# Patient Record
Sex: Male | Born: 1950 | Race: Black or African American | Hispanic: No | Marital: Married | Smoking: Former smoker
Health system: Southern US, Community
[De-identification: ages and names within clinical notes are randomized; demographics above are authoritative.]

## PROBLEM LIST (undated history)

## (undated) DIAGNOSIS — Z531 Procedure and treatment not carried out because of patient's decision for reasons of belief and group pressure: Secondary | ICD-10-CM

## (undated) DIAGNOSIS — R011 Cardiac murmur, unspecified: Secondary | ICD-10-CM

## (undated) DIAGNOSIS — E119 Type 2 diabetes mellitus without complications: Secondary | ICD-10-CM

## (undated) DIAGNOSIS — IMO0001 Reserved for inherently not codable concepts without codable children: Secondary | ICD-10-CM

## (undated) DIAGNOSIS — F419 Anxiety disorder, unspecified: Secondary | ICD-10-CM

## (undated) DIAGNOSIS — C9110 Chronic lymphocytic leukemia of B-cell type not having achieved remission: Secondary | ICD-10-CM

## (undated) DIAGNOSIS — M545 Low back pain, unspecified: Secondary | ICD-10-CM

## (undated) DIAGNOSIS — G4733 Obstructive sleep apnea (adult) (pediatric): Secondary | ICD-10-CM

## (undated) DIAGNOSIS — J189 Pneumonia, unspecified organism: Secondary | ICD-10-CM

## (undated) DIAGNOSIS — D573 Sickle-cell trait: Secondary | ICD-10-CM

## (undated) DIAGNOSIS — Z9989 Dependence on other enabling machines and devices: Secondary | ICD-10-CM

## (undated) DIAGNOSIS — K219 Gastro-esophageal reflux disease without esophagitis: Secondary | ICD-10-CM

## (undated) DIAGNOSIS — I509 Heart failure, unspecified: Secondary | ICD-10-CM

## (undated) DIAGNOSIS — M1990 Unspecified osteoarthritis, unspecified site: Secondary | ICD-10-CM

## (undated) DIAGNOSIS — G8929 Other chronic pain: Secondary | ICD-10-CM

## (undated) DIAGNOSIS — IMO0002 Reserved for concepts with insufficient information to code with codable children: Secondary | ICD-10-CM

## (undated) HISTORY — PX: SHOULDER ARTHROSCOPY W/ ROTATOR CUFF REPAIR: SHX2400

## (undated) HISTORY — PX: CATARACT EXTRACTION W/ INTRAOCULAR LENS  IMPLANT, BILATERAL: SHX1307

## (undated) HISTORY — PX: COLONOSCOPY W/ POLYPECTOMY: SHX1380

---

## 2009-08-30 HISTORY — PX: CARDIAC CATHETERIZATION: SHX172

## 2010-03-27 ENCOUNTER — Inpatient Hospital Stay (HOSPITAL_COMMUNITY): Admission: EM | Admit: 2010-03-27 | Discharge: 2010-03-29 | Payer: Self-pay | Admitting: Emergency Medicine

## 2010-11-14 LAB — LIPID PANEL
Cholesterol: 101 mg/dL (ref 0–200)
HDL: 30 mg/dL — ABNORMAL LOW (ref >39)
LDL Cholesterol: 57 mg/dL (ref 0–99)
Total CHOL/HDL Ratio: 3.4 RATIO
VLDL: 14 mg/dL (ref 0–40)

## 2010-11-14 LAB — GLUCOSE, CAPILLARY
Glucose-Capillary: 101 mg/dL — ABNORMAL HIGH (ref 70–99)
Glucose-Capillary: 128 mg/dL — ABNORMAL HIGH (ref 70–99)
Glucose-Capillary: 207 mg/dL — ABNORMAL HIGH (ref 70–99)
Glucose-Capillary: 208 mg/dL — ABNORMAL HIGH (ref 70–99)
Glucose-Capillary: 289 mg/dL — ABNORMAL HIGH (ref 70–99)

## 2010-11-14 LAB — CBC
HCT: 45.4 % (ref 39.0–52.0)
Hemoglobin: 15.1 g/dL (ref 13.0–17.0)
Hemoglobin: 16.2 g/dL (ref 13.0–17.0)
MCH: 28.1 pg (ref 26.0–34.0)
MCV: 84.1 fL (ref 78.0–100.0)
Platelets: 217 10*3/uL (ref 150–400)
RBC: 5.37 MIL/uL (ref 4.22–5.81)
RBC: 5.78 MIL/uL (ref 4.22–5.81)
RDW: 16.7 % — ABNORMAL HIGH (ref 11.5–15.5)
RDW: 16.7 % — ABNORMAL HIGH (ref 11.5–15.5)
WBC: 6 10*3/uL (ref 4.0–10.5)

## 2010-11-14 LAB — POCT CARDIAC MARKERS
CKMB, poc: 16.2 ng/mL (ref 1.0–8.0)
Myoglobin, poc: 411 ng/mL (ref 12–200)
Troponin i, poc: <0.05 ng/mL (ref 0.00–0.09)

## 2010-11-14 LAB — COMPREHENSIVE METABOLIC PANEL
ALT: 37 U/L (ref 0–53)
AST: 41 U/L — ABNORMAL HIGH (ref 0–37)
Alkaline Phosphatase: 76 U/L (ref 39–117)
BUN: 11 mg/dL (ref 6–23)
CO2: 26 mEq/L (ref 19–32)
Calcium: 8.5 mg/dL (ref 8.4–10.5)
Chloride: 102 mEq/L (ref 96–112)
GFR calc Af Amer: >60 mL/min (ref >60)
Total Bilirubin: 0.9 mg/dL (ref 0.3–1.2)
Total Protein: 6.1 g/dL (ref 6.0–8.3)

## 2010-11-14 LAB — HEPARIN LEVEL (UNFRACTIONATED): Heparin Unfractionated: <0.1 IU/mL — ABNORMAL LOW (ref 0.30–0.70)

## 2010-11-14 LAB — BRAIN NATRIURETIC PEPTIDE
Pro B Natriuretic peptide (BNP): 147 pg/mL — ABNORMAL HIGH (ref 0.0–100.0)
Pro B Natriuretic peptide (BNP): 247 pg/mL — ABNORMAL HIGH (ref 0.0–100.0)

## 2010-11-14 LAB — BASIC METABOLIC PANEL
BUN: 11 mg/dL (ref 6–23)
Calcium: 9 mg/dL (ref 8.4–10.5)
Chloride: 105 mEq/L (ref 96–112)
Potassium: 3.7 mEq/L (ref 3.5–5.1)

## 2010-11-14 LAB — PROTIME-INR
INR: 1.07 (ref 0.00–1.49)
Prothrombin Time: 13.8 seconds (ref 11.6–15.2)

## 2010-11-14 LAB — DIFFERENTIAL
Basophils Absolute: 0 10*3/uL (ref 0.0–0.1)
Lymphocytes Relative: 26 % (ref 12–46)
Lymphs Abs: 1.6 10*3/uL (ref 0.7–4.0)
Monocytes Relative: 9 % (ref 3–12)

## 2010-11-14 LAB — HEMOGLOBIN A1C
Hgb A1c MFr Bld: 11.7 % — ABNORMAL HIGH (ref <5.7)
Mean Plasma Glucose: 289 mg/dL — ABNORMAL HIGH (ref <117)

## 2010-11-14 LAB — POCT I-STAT, CHEM 8
BUN: 12 mg/dL (ref 6–23)
Calcium, Ion: 1.13 mmol/L (ref 1.12–1.32)
Glucose, Bld: 149 mg/dL — ABNORMAL HIGH (ref 70–99)
TCO2: 24 mmol/L (ref 0–100)

## 2010-11-14 LAB — TROPONIN I: Troponin I: 0.09 ng/mL — ABNORMAL HIGH (ref 0.00–0.06)

## 2010-11-14 LAB — CARDIAC PANEL(CRET KIN+CKTOT+MB+TROPI)
CK, MB: 12.1 ng/mL (ref 0.3–4.0)
Relative Index: 3.3 — ABNORMAL HIGH (ref 0.0–2.5)

## 2010-11-14 LAB — CK TOTAL AND CKMB (NOT AT ARMC): CK, MB: 16.6 ng/mL (ref 0.3–4.0)

## 2012-08-30 DIAGNOSIS — J189 Pneumonia, unspecified organism: Secondary | ICD-10-CM

## 2012-08-30 HISTORY — DX: Pneumonia, unspecified organism: J18.9

## 2013-01-12 ENCOUNTER — Emergency Department (HOSPITAL_COMMUNITY): Payer: Worker's Compensation

## 2013-01-12 ENCOUNTER — Emergency Department (HOSPITAL_COMMUNITY)
Admission: EM | Admit: 2013-01-12 | Discharge: 2013-01-12 | Disposition: A | Discharge status: Home / Self Care | Payer: Worker's Compensation | Attending: Emergency Medicine | Admitting: Emergency Medicine

## 2013-01-12 ENCOUNTER — Encounter (HOSPITAL_COMMUNITY): Payer: Self-pay | Admitting: Family Medicine

## 2013-01-12 DIAGNOSIS — I252 Old myocardial infarction: Secondary | ICD-10-CM | POA: Insufficient documentation

## 2013-01-12 DIAGNOSIS — M545 Low back pain, unspecified: Secondary | ICD-10-CM | POA: Insufficient documentation

## 2013-01-12 DIAGNOSIS — S52609A Unspecified fracture of lower end of unspecified ulna, initial encounter for closed fracture: Secondary | ICD-10-CM | POA: Insufficient documentation

## 2013-01-12 DIAGNOSIS — W108XXA Fall (on) (from) other stairs and steps, initial encounter: Secondary | ICD-10-CM | POA: Insufficient documentation

## 2013-01-12 DIAGNOSIS — Z79899 Other long term (current) drug therapy: Secondary | ICD-10-CM | POA: Insufficient documentation

## 2013-01-12 DIAGNOSIS — M25529 Pain in unspecified elbow: Secondary | ICD-10-CM | POA: Insufficient documentation

## 2013-01-12 DIAGNOSIS — Z7982 Long term (current) use of aspirin: Secondary | ICD-10-CM | POA: Insufficient documentation

## 2013-01-12 DIAGNOSIS — Z87891 Personal history of nicotine dependence: Secondary | ICD-10-CM | POA: Insufficient documentation

## 2013-01-12 DIAGNOSIS — M25569 Pain in unspecified knee: Secondary | ICD-10-CM | POA: Insufficient documentation

## 2013-01-12 DIAGNOSIS — R51 Headache: Secondary | ICD-10-CM | POA: Insufficient documentation

## 2013-01-12 DIAGNOSIS — M25519 Pain in unspecified shoulder: Secondary | ICD-10-CM | POA: Insufficient documentation

## 2013-01-12 DIAGNOSIS — T148XXA Other injury of unspecified body region, initial encounter: Secondary | ICD-10-CM

## 2013-01-12 DIAGNOSIS — W19XXXA Unspecified fall, initial encounter: Secondary | ICD-10-CM

## 2013-01-12 DIAGNOSIS — E119 Type 2 diabetes mellitus without complications: Secondary | ICD-10-CM | POA: Insufficient documentation

## 2013-01-12 DIAGNOSIS — Z794 Long term (current) use of insulin: Secondary | ICD-10-CM | POA: Insufficient documentation

## 2013-01-12 DIAGNOSIS — Y990 Civilian activity done for income or pay: Secondary | ICD-10-CM | POA: Insufficient documentation

## 2013-01-12 DIAGNOSIS — Y929 Unspecified place or not applicable: Secondary | ICD-10-CM | POA: Insufficient documentation

## 2013-01-12 LAB — POCT I-STAT, CHEM 8
BUN: 16 mg/dL (ref 6–23)
Calcium, Ion: 1.22 mmol/L (ref 1.13–1.30)
Chloride: 104 mEq/L (ref 96–112)
Creatinine, Ser: 1.2 mg/dL (ref 0.50–1.35)
Glucose, Bld: 121 mg/dL — ABNORMAL HIGH (ref 70–99)
Potassium: 3.6 mEq/L (ref 3.5–5.1)

## 2013-01-12 MED ORDER — LORAZEPAM 2 MG/ML IJ SOLN
1.0000 mg | Freq: Once | INTRAMUSCULAR | Status: AC
  Administered 2013-01-12: 1 mg via INTRAMUSCULAR
  Filled 2013-01-12: qty 1

## 2013-01-12 MED ORDER — MORPHINE SULFATE 10 MG/ML IJ SOLN
10.0000 mg | Freq: Once | INTRAMUSCULAR | Status: AC
  Administered 2013-01-12: 10 mg via INTRAMUSCULAR
  Filled 2013-01-12: qty 1

## 2013-01-12 NOTE — ED Notes (Signed)
Pt lost footing on last step of flight of stairs. Pt fell into mud, hitting right knee and hyperextending left wrist. Pt denies pain to right knee, c/o left wrist pain. Per PTAR, pt also stated feeling a "pop" in lower back. Pt c/o "pinch in neck to left shoulder and mid and lower back pain. Pt in LSB, head blocks, and C-collar. Alert and oriented x4. Pt in NAD at this time, interactive

## 2013-01-12 NOTE — ED Notes (Signed)
Wife stepping out. States they took him over to radiology

## 2013-01-12 NOTE — ED Notes (Signed)
CBG of 158 completed.

## 2013-01-12 NOTE — ED Provider Notes (Signed)
History     CSN: [id number]  Arrival date & time 01/12/13  4098   First MD Initiated Contact with Patient 01/12/13 [phone]      Chief Complaint  Patient presents with  . Fall    (Consider location/radiation/quality/duration/timing/severity/associated sxs/prior treatment) HPI 62 year old male with a past medical history of insulin-dependent diabetes mellitus, previous MI, status post cardiac CATHETERIZATION presents to the emergency department after FALL.Marland Kitchen Arrives via EMS with cervical collar, on long spine board in spinal precautions. Patient was at work when he lost his footing and fell off of a 1 Stair onto his right knee.patient fell onto his left side and heard something "pop in his back.  He is complaining of right knee pain, left knee pain, hip pain, pelvic pain, sacral pain, back pain, left shoulder pain, left elbow pain, left wrist pain, left fourth and fifth finger pain, and global headache.patient states that his blood sugars have been running normally he denies any high-power hyperglycemic events of weight, he denies having headache previous to the fall.  He denies hitting his head or loss of consciousness.  Patient denies any chest pressure, shortness of breath, he does have left shoulder pain that he feels is radiating up into his neck. Patient denies any chest pain, shortness of breath, nausea, vomiting, diaphoresis.  Denies fevers, chills, myalgias, arthralgias. Denies DOE, SOB, chest tightness or pressure, radiation to left arm, jaw or back, or diaphoresis. Denies dysuria, flank pain, suprapubic pain, frequency, urgency, or hematuria. Denies headaches, light headedness, weakness, visual disturbances. Denies abdominal pain, nausea, vomiting, diarrhea or constipation.    Past Medical History  Diagnosis Date  . Diabetes   . MI (myocardial infarction)     Past Surgical History  Procedure Laterality Date  . Cardiac catheterization  2011    No family history on  file.  History  Substance Use Topics  . Smoking status: Former Smoker    Types: Cigarettes  . Smokeless tobacco: Never Used  . Alcohol Use: No      Review of Systems Ten systems reviewed and are negative for acute change, except as noted in the HPI.   Allergies  Review of patient's allergies indicates no known allergies.  Home Medications   Current Outpatient Rx  Name  Route  Sig  Dispense  Refill  . aspirin 81 MG chewable tablet   Oral   Chew 81 mg by mouth daily.         . fish oil-omega-3 fatty acids 1000 MG capsule   Oral   Take 1 g by mouth daily.         Marland Kitchen FLUoxetine HCl (PROZAC PO)   Oral   Take 1 tablet by mouth daily.         Marland Kitchen GABAPENTIN, PHN, PO   Oral   Take by mouth.         . Insulin Human (INSULIN PUMP) 100 unit/ml SOLN   Subcutaneous   Inject into the skin continuous. novolog         . Multiple Vitamin (MULTIVITAMIN WITH MINERALS) TABS   Oral   Take 1 tablet by mouth daily.         Marland Kitchen OVER THE COUNTER MEDICATION   Oral   Take 1 tablet by mouth daily. Acid reducer         . PRESCRIPTION MEDICATION   Oral   Take 1 tablet by mouth. betablocker           BP 153/83  Pulse 96  Temp(Src) 98 F (36.7 C)  Resp 20  SpO2 94%  Physical Exam  Nursing note and vitals reviewed. Constitutional: He is oriented to person, place, and time. He appears well-developed and well-nourished. No distress.  Appears anxious  HENT:  Head: Normocephalic and atraumatic.  Eyes: Conjunctivae and EOM are normal. Pupils are equal, round, and reactive to light. No scleral icterus.  No dental trauma  Neck: Normal range of motion. Neck supple. No JVD present. No tracheal deviation present.  Cardiovascular: Normal rate, regular rhythm, normal heart sounds and intact distal pulses.   No murmur heard. Pulmonary/Chest: Effort normal and breath sounds normal. No respiratory distress. He has no wheezes. He exhibits no tenderness.  No crepitus  Abdominal:  Soft. He exhibits no distension. There is no tenderness. There is no guarding.  No bruising  Musculoskeletal: He exhibits no edema.  Abrasion over the left lateral knee. Full range of motion bilateral knees mild tenderness to palpation.  No pelvic instability.  Some tenderness over the left hip.  No bruising ecchymosis or deformities.  Full range of motion of bilateral shoulders tender to palpation at the a.c. Joint with positive crossarm test.  Tender to palpation of the left elbow, left wrist left fourth and fifth fingers.  Again no ecchymosis or deformity.  Lymphadenopathy:    He has no cervical adenopathy.  Neurological: He is alert and oriented to person, place, and time. No cranial nerve deficit. Coordination normal.  Skin: Skin is warm and dry. He is not diaphoretic.  Psychiatric: His behavior is normal.    ED Course  Procedures (including critical care time)  Labs Reviewed  GLUCOSE, CAPILLARY - Abnormal; Notable for the following:    Glucose-Capillary 158 (*)    All other components within normal limits   No results found.   1. Fall, initial encounter   2. Avulsion fracture       MDM  7:56 AM Filed Vitals:   01/12/13 0639  BP: 153/83  Pulse: 96  Temp: 98 F (36.7 C)  Resp: 20  Patient with pan-. Positive pain complaints after a fall with localized to the mechanism of injury.  Patient appears to be an otherwise healthy 62 year old male with no history of osteoporosis or osteopenia.  He has many pain complaints.  I do not suspect fracture.  However will obtain imaging of patient's areas of complaint.  Pain control initiated. Will assess for acs as patient fell, he is anxious appearing and poor historian. + left shoulder pain that I believe is from mechanical fall.  11:21 AM Negative troponin.  I do not suspect ACS. Small avulsion fracture of the left wrist. Again I do not suspect ACS and no dyspnea, SOB, CP.  Patient with known spondylolysis and herniated discs at [facility]  on MRI.  F/u handy for ortho.       Arthor Captain, PA-C 01/12/13 1125

## 2013-01-12 NOTE — ED Notes (Signed)
Abigail, PA back in to speak with patient and family.

## 2013-01-12 NOTE — ED Notes (Signed)
Abigail, PA at the bedside. Pt removed from the backboard.

## 2013-01-14 NOTE — ED Provider Notes (Signed)
Medical screening examination/treatment/procedure(s) were performed by non-physician practitioner and as supervising physician I was immediately available for consultation/collaboration.   Weber Monnier M Zyden Suman, DO 01/14/13 2331 

## 2013-12-18 ENCOUNTER — Telehealth: Payer: Self-pay | Admitting: Vascular Surgery

## 2013-12-18 ENCOUNTER — Encounter (HOSPITAL_COMMUNITY): Payer: Self-pay | Admitting: Pharmacy Technician

## 2013-12-18 ENCOUNTER — Other Ambulatory Visit: Payer: Self-pay

## 2013-12-18 NOTE — Telephone Encounter (Addendum)
Message copied by Gena Fray on Tue Dec 18, 2013  3:36 PM ------      Message from: Denman George      Created: Tue Dec 18, 2013  9:33 AM      Regarding: Consult appt. with [facility]SD       This pt. needs a new pt. Consult appt. with CSD prior to ALIF on 12/27/13.  Please remind him to bring copy of L-S spine film with him to the appt.  ------  12/18/13: spoke with pt, mailed letter, dpm

## 2013-12-21 ENCOUNTER — Ambulatory Visit (HOSPITAL_COMMUNITY)
Admission: RE | Admit: 2013-12-21 | Discharge: 2013-12-21 | Disposition: A | Discharge status: Home / Self Care | Payer: Self-pay | Source: Ambulatory Visit | Attending: Anesthesiology | Admitting: Anesthesiology

## 2013-12-21 ENCOUNTER — Encounter (HOSPITAL_COMMUNITY)
Admission: RE | Admit: 2013-12-21 | Discharge: 2013-12-21 | Disposition: A | Discharge status: Home / Self Care | Payer: Worker's Compensation | Source: Ambulatory Visit | Attending: Orthopedic Surgery | Admitting: Orthopedic Surgery

## 2013-12-21 ENCOUNTER — Encounter (HOSPITAL_COMMUNITY): Payer: Self-pay

## 2013-12-21 DIAGNOSIS — Z01818 Encounter for other preprocedural examination: Secondary | ICD-10-CM | POA: Insufficient documentation

## 2013-12-21 DIAGNOSIS — Z01812 Encounter for preprocedural laboratory examination: Secondary | ICD-10-CM | POA: Insufficient documentation

## 2013-12-21 HISTORY — DX: Pneumonia, unspecified organism: J18.9

## 2013-12-21 HISTORY — DX: Chronic lymphocytic leukemia of B-cell type not having achieved remission: C91.10

## 2013-12-21 HISTORY — DX: Cardiac murmur, unspecified: R01.1

## 2013-12-21 HISTORY — DX: Reserved for concepts with insufficient information to code with codable children: IMO0002

## 2013-12-21 HISTORY — DX: Gastro-esophageal reflux disease without esophagitis: K21.9

## 2013-12-21 HISTORY — DX: Sickle-cell trait: D57.3

## 2013-12-21 HISTORY — DX: Anxiety disorder, unspecified: F41.9

## 2013-12-21 LAB — SURGICAL PCR SCREEN
MRSA, PCR: NEGATIVE
Staphylococcus aureus: NEGATIVE

## 2013-12-21 LAB — CBC
HEMATOCRIT: 34.7 % — AB (ref 39.0–52.0)
HEMOGLOBIN: 11.8 g/dL — AB (ref 13.0–17.0)
MCH: 25.3 pg — ABNORMAL LOW (ref 26.0–34.0)
MCHC: 34 g/dL (ref 30.0–36.0)
MCV: 74.5 fL — ABNORMAL LOW (ref 78.0–100.0)
Platelets: 188 10*3/uL (ref 150–400)
RBC: 4.66 MIL/uL (ref 4.22–5.81)
RDW: 16.7 % — ABNORMAL HIGH (ref 11.5–15.5)
WBC: 11.1 10*3/uL — ABNORMAL HIGH (ref 4.0–10.5)

## 2013-12-21 LAB — BASIC METABOLIC PANEL
BUN: 20 mg/dL (ref 6–23)
CHLORIDE: 107 meq/L (ref 96–112)
CO2: 25 mEq/L (ref 19–32)
Calcium: 9.9 mg/dL (ref 8.4–10.5)
Creatinine, Ser: 1.42 mg/dL — ABNORMAL HIGH (ref 0.50–1.35)
GFR calc Af Amer: 60 mL/min — ABNORMAL LOW (ref >90)
GFR, EST NON AFRICAN AMERICAN: 51 mL/min — AB (ref >90)
GLUCOSE: 135 mg/dL — AB (ref 70–99)
POTASSIUM: 4.4 meq/L (ref 3.7–5.3)
SODIUM: 145 meq/L (ref 137–147)

## 2013-12-21 LAB — NO BLOOD PRODUCTS

## 2013-12-21 NOTE — Pre-Procedure Instructions (Signed)
Eugene [name]  12/21/2013   Your procedure is scheduled on:  Thursday, April 30.  Report to [facility], Main Entrance [facility] "[facility]" at 5:30AM.  Call this number if you have problems the morning of surgery: [phone]   Remember:   Do not eat food or drink liquids after midnight April 29.   Take these medicines the morning of surgery with A SIP OF WATER: carvedilol (COREG),gabapentin (NEURONTIN). Use inhaler if needed and bring it to the hospital with you.               Stop taking:  fexofenadine-pseudoephedrine (ALLEGRA-D 12, Multiple Vitamin,omega-3, Brain Toner, Aspirin.     Do not wear jewelry, make-up or nail polish.  Do not wear lotions, powders, or perfumes.    Men may shave face and neck.  Do not bring valuables to the hospital.  [facility] is not responsible for any belongings or valuables.               Contacts, dentures or bridgework may not be worn into surgery.  Leave suitcase in the car. After surgery it may be brought to your room.  For patients admitted to the hospital, discharge time is determined by your treatment team.               Special Instructions: Review  [facility] -  Preparing For Surgery.   Please read over the following fact sheets that you were given: Pain Booklet, Coughing and Deep Breathing and Surgical Site Infection Prevention

## 2013-12-21 NOTE — Progress Notes (Signed)
Eugene [name] reported as he entered the PAT interview room that Dr Rolena Infante said that he would be seeing someone from anesthesia. I explained that we need to do interview 1st, so that we have information in the computer.  Eugene [name] [facility] seen by [facility] D[facility], except form oncologist,Dr Veterinary surgeon at [facility].  Patient sees a cardiologist, endroconlogist at the [place].  Patient also reports that he had a sleep study at the [place].  "I have been cleared by all my Drs., they have all the clearance notes at Dr Rolena Infante.  I spoke to [facility] at Dr Rolena Infante' office, she said that she has the medical clearance that said he gad a cardiology evaluation. "[facility] will not [facility] the cardi[facility] records, they have the medical Dr do the clearance.    I instructed  Eugene [name] to wear his Insulin pump to the hospital.  Eugene [name], said , "No they told me not to wear Insulin Pump, that I would be on an Insulin drip and monitored by a Dr."  I called Gwen Pounds the diabetic coordinator and spoke to her with patient in the room.  Eugene [name] said that patient is to wear Insulin Pump to the hospital , but he will have it stopped and he will be on an Insulin Drip with the Glucommander in use.   I explained this to the patient and he agreed that he will wear Insulin pump to the hospital.  I also asked patient to bring his CPAP mask.  I notified Ebony Hail that patient said he was told by Dr Rolena Infante to see anesthesia PA or Dr while he was here.  Dr Marcie Bal came to see the patient.  I faxed a request to the [facility] request for cardiology notes and studies.

## 2013-12-25 ENCOUNTER — Encounter: Payer: Self-pay | Admitting: Vascular Surgery

## 2013-12-26 ENCOUNTER — Encounter: Payer: Self-pay | Admitting: Vascular Surgery

## 2013-12-26 ENCOUNTER — Encounter (HOSPITAL_COMMUNITY): Payer: Self-pay | Admitting: Certified Registered Nurse Anesthetist

## 2013-12-26 ENCOUNTER — Ambulatory Visit (INDEPENDENT_AMBULATORY_CARE_PROVIDER_SITE_OTHER): Payer: Worker's Compensation | Admitting: Vascular Surgery

## 2013-12-26 VITALS — BP 125/74 | HR 87 | Ht 72.0 in | Wt 201.6 lb

## 2013-12-26 DIAGNOSIS — M5137 Other intervertebral disc degeneration, lumbosacral region: Secondary | ICD-10-CM | POA: Insufficient documentation

## 2013-12-26 DIAGNOSIS — M51379 Other intervertebral disc degeneration, lumbosacral region without mention of lumbar back pain or lower extremity pain: Secondary | ICD-10-CM | POA: Diagnosis not present

## 2013-12-26 DIAGNOSIS — IMO0002 Reserved for concepts with insufficient information to code with codable children: Secondary | ICD-10-CM

## 2013-12-26 MED ORDER — CHLORHEXIDINE GLUCONATE 4 % EX LIQD
60.0000 mL | Freq: Once | CUTANEOUS | Status: DC
  Filled 2013-12-26: qty 60

## 2013-12-26 NOTE — Assessment & Plan Note (Signed)
The patient appears to be a reasonable candidate for anterior retroperitoneal exposure of L5-S1. I have reviewed our role in exposure of the spine in order to allow anterior lumbar interbody fusion at the appropriate levels. We have discussed the potential complications of surgery, including but not limited to, arterial or venous injury, thrombosis, or bleeding. We have also discussed the potential risks of wound healing problems, the development of a hernia, nerve injury, leg swelling, or other unpredictable medical problems. I've also explained that for the L5-S1 level there is a small risk of retrograde ejaculation. All the patient's questions were answered and they are agreeable to proceed. The surgery is scheduled for tomorrow.

## 2013-12-26 NOTE — Progress Notes (Signed)
[facility] [place]  Patient name: Eugene [name] MRN: [id number] DOB: 09-Nov-1950 Sex: male  REASON FOR CONSULT: Evaluate for ALIF. Referred by Dr. Rolena Infante.  HPI: Eugene [name] is a 63 y.o. male who is had low back pain for 2-3 years. This came on gradually and is fairly constant. Approximately a year ago he fell down some steps in this pain became significantly worse. He has disabling low back pain which has failed conservative treatment. He's had injection therapy and physical therapy but continues to have significant pain. He was evaluated and felt to be a candidate for ALIF at the L5-S1 level. We were consult to evaluate him for retroperitoneal exposure of this disc level.   Past Medical History  Diagnosis Date  . Diabetes   . Sleep apnea   . MI (myocardial infarction) 2011  . Anxiety   . Heart murmur     "not to be concerned"  . Pneumonia 2014    had wheezing with this and was giving the inhaler  . GERD (gastroesophageal reflux disease)   . CLL (chronic lymphocytic leukemia)   . Sickle cell trait   . DDD (degenerative disc disease)    History reviewed. No pertinent family history.  SOCIAL HISTORY: History  Substance Use Topics  . Smoking status: Former Smoker -- 10 years    Types: Cigarettes  . Smokeless tobacco: Never Used  . Alcohol Use: No   Allergies  Allergen Reactions  . Other Other (See Comments)    Mussels- no other seafood.  "Foams at the mouth, slurred speech.   Current Outpatient Prescriptions  Medication Sig Dispense Refill  . acetaminophen (TYLENOL) 500 MG tablet Take 1,000 mg by mouth every 8 (eight) hours as needed for mild pain. Takes with oxycodone.      Marland Kitchen albuterol (PROVENTIL HFA;VENTOLIN HFA) 108 (90 BASE) MCG/ACT inhaler Inhale 2 puffs into the lungs every 6 (six) hours as needed for wheezing.      Marland Kitchen aspirin 325 MG tablet Take 325 mg by mouth daily.      . carvedilol (COREG) 6.25 MG tablet Take 3.125 mg by mouth 2 (two)  times daily.      . clindamycin (CLEOCIN T) 1 % SWAB Apply 1 application topically daily.      Marland Kitchen dextrose 4 G chewable tablet Chew 16 g by mouth as needed for low blood sugar. Repeat every 15 minutes if blood sugar is less than 70.      . fexofenadine-pseudoephedrine (ALLEGRA-D 12 HOUR) 60-120 MG per tablet Take 1 tablet by mouth 2 (two) times daily as needed (Allergies).      Marland Kitchen FLUoxetine (PROZAC) 20 MG capsule Take 20 mg by mouth daily.      . furosemide (LASIX) 20 MG tablet Take 20 mg by mouth daily as needed (Diuretic).      Marland Kitchen gabapentin (NEURONTIN) 300 MG capsule Take 300 mg by mouth 3 (three) times daily.      . Insulin Human (INSULIN PUMP) 100 unit/ml SOLN Inject into the skin continuous. novolog      . metFORMIN (GLUCOPHAGE) 1000 MG tablet Take 1,000 mg by mouth 2 (two) times daily.      . Multiple Vitamin (MULTIVITAMIN WITH MINERALS) TABS Take 1 tablet by mouth daily.      . NON FORMULARY CPAP      . omega-3 acid ethyl esters (LOVAZA) 1 G capsule Take 2 g by mouth 2 (two) times daily.      Marland Kitchen omeprazole (  PRILOSEC) 20 MG capsule Take 20 mg by mouth daily as needed (acid reflux).      Marland Kitchen OVER THE COUNTER MEDICATION Take 5 mg by mouth 3 (three) times daily. 'Brain toner'--herbal supplement strengthens nerves in brain      . oxycodone (OXY-IR) 5 MG capsule Take 5 mg by mouth 2 (two) times daily as needed for pain. Takes with tylenol      . sildenafil (VIAGRA) 100 MG tablet Take 100 mg by mouth daily as needed for erectile dysfunction.       No current facility-administered medications for this visit.   REVIEW OF SYSTEMS: Valu.Nieves ] denotes positive finding; [  ] denotes negative finding  CARDIOVASCULAR:  [ ]  chest pain   [ ]  chest pressure   [ ]  palpitations   [ ]  orthopnea   [ ]  dyspnea on exertion   [ ]  claudication   [ ]  rest pain   [ ]  DVT   [ ]  phlebitis PULMONARY:   [ ]  productive cough   [ ]  asthma   [ ]  wheezing NEUROLOGIC:   [ ]  weakness  [ ]  paresthesias  [ ]  aphasia  [ ]  amaurosis  [ ]   dizziness HEMATOLOGIC:   [ ]  bleeding problems   [ ]  clotting disorders MUSCULOSKELETAL:  [ ]  joint pain   [ ]  joint swelling [ ]  leg swelling GASTROINTESTINAL: [ ]   blood in stool  [ ]   hematemesis GENITOURINARY:  [ ]   dysuria  [ ]   hematuria PSYCHIATRIC:  [ ]  history of major depression INTEGUMENTARY:  [ ]  rashes  [ ]  ulcers CONSTITUTIONAL:  [ ]  fever   [ ]  chills  PHYSICAL EXAM: Filed Vitals:   12/26/13 0907  BP: 125/74  Pulse: 87  Height: 6' (1.829 m)  Weight: 201 lb 9.6 oz (91.445 kg)  SpO2: 99%   Body mass index is 27.34 kg/(m^2). GENERAL: The patient is a well-nourished male, in no acute distress. The vital signs are documented above. CARDIOVASCULAR: There is a regular rate and rhythm. I do not detect carotid bruits. He has palpable femoral pulses and palpable dorsalis pedis pulses bilaterally. I cannot palpate posterior tibial pulses. PULMONARY: There is good air exchange bilaterally without wheezing or rales. ABDOMEN: Soft and non-tender with normal pitched bowel sounds.  MUSCULOSKELETAL: There are no major deformities or cyanosis. NEUROLOGIC: No focal weakness or paresthesias are detected. SKIN: There are no ulcers or rashes noted. PSYCHIATRIC: The patient has a normal affect.  DATA:  I reviewed his lumbar spine films from 2014. He has significant degenerative disc disease L5-S1.  Get an MRI yesterday and I do not have these results currently.  MEDICAL ISSUES:  DDD (degenerative disc disease), lumbosacral The patient appears to be a reasonable candidate for anterior retroperitoneal exposure of L5-S1. I have reviewed our role in exposure of the spine in order to allow anterior lumbar interbody fusion at the appropriate levels. We have discussed the potential complications of surgery, including but not limited to, arterial or venous injury, thrombosis, or bleeding. We have also discussed the potential risks of wound healing problems, the development of a hernia, nerve  injury, leg swelling, or other unpredictable medical problems. I've also explained that for the L5-S1 level there is a small risk of retrograde ejaculation. All the patient's questions were answered and they are agreeable to proceed. The surgery is scheduled for tomorrow.   [name] [facility] [place] [facility]: [phone]

## 2013-12-27 ENCOUNTER — Inpatient Hospital Stay (HOSPITAL_COMMUNITY): Payer: Worker's Compensation | Admitting: Certified Registered Nurse Anesthetist

## 2013-12-27 ENCOUNTER — Encounter (HOSPITAL_COMMUNITY)
Admission: RE | Disposition: A | Discharge status: Home / Self Care | Payer: Self-pay | Source: Ambulatory Visit | Attending: Orthopedic Surgery

## 2013-12-27 ENCOUNTER — Encounter (HOSPITAL_COMMUNITY): Payer: Self-pay | Admitting: *Deleted

## 2013-12-27 ENCOUNTER — Inpatient Hospital Stay (HOSPITAL_COMMUNITY)
Admission: RE | Admit: 2013-12-27 | Discharge: 2013-12-31 | DRG: 460 | Disposition: A | Discharge status: Home / Self Care | Payer: Worker's Compensation | Source: Ambulatory Visit | Attending: Orthopedic Surgery | Admitting: Orthopedic Surgery

## 2013-12-27 ENCOUNTER — Inpatient Hospital Stay (HOSPITAL_COMMUNITY): Payer: Worker's Compensation

## 2013-12-27 ENCOUNTER — Encounter (HOSPITAL_COMMUNITY): Payer: Worker's Compensation | Admitting: Vascular Surgery

## 2013-12-27 DIAGNOSIS — Z7982 Long term (current) use of aspirin: Secondary | ICD-10-CM

## 2013-12-27 DIAGNOSIS — R042 Hemoptysis: Secondary | ICD-10-CM | POA: Diagnosis not present

## 2013-12-27 DIAGNOSIS — K219 Gastro-esophageal reflux disease without esophagitis: Secondary | ICD-10-CM | POA: Diagnosis present

## 2013-12-27 DIAGNOSIS — M549 Dorsalgia, unspecified: Secondary | ICD-10-CM | POA: Diagnosis present

## 2013-12-27 DIAGNOSIS — Z79899 Other long term (current) drug therapy: Secondary | ICD-10-CM

## 2013-12-27 DIAGNOSIS — IMO0002 Reserved for concepts with insufficient information to code with codable children: Secondary | ICD-10-CM

## 2013-12-27 DIAGNOSIS — E119 Type 2 diabetes mellitus without complications: Secondary | ICD-10-CM | POA: Diagnosis present

## 2013-12-27 DIAGNOSIS — M5137 Other intervertebral disc degeneration, lumbosacral region: Principal | ICD-10-CM | POA: Diagnosis present

## 2013-12-27 DIAGNOSIS — K5900 Constipation, unspecified: Secondary | ICD-10-CM | POA: Diagnosis present

## 2013-12-27 DIAGNOSIS — F411 Generalized anxiety disorder: Secondary | ICD-10-CM | POA: Diagnosis present

## 2013-12-27 DIAGNOSIS — Z981 Arthrodesis status: Secondary | ICD-10-CM

## 2013-12-27 DIAGNOSIS — C9110 Chronic lymphocytic leukemia of B-cell type not having achieved remission: Secondary | ICD-10-CM | POA: Diagnosis present

## 2013-12-27 DIAGNOSIS — D573 Sickle-cell trait: Secondary | ICD-10-CM | POA: Diagnosis present

## 2013-12-27 DIAGNOSIS — Z87891 Personal history of nicotine dependence: Secondary | ICD-10-CM

## 2013-12-27 DIAGNOSIS — M51379 Other intervertebral disc degeneration, lumbosacral region without mention of lumbar back pain or lower extremity pain: Principal | ICD-10-CM | POA: Diagnosis present

## 2013-12-27 DIAGNOSIS — G4733 Obstructive sleep apnea (adult) (pediatric): Secondary | ICD-10-CM | POA: Diagnosis present

## 2013-12-27 DIAGNOSIS — Z9641 Presence of insulin pump (external) (internal): Secondary | ICD-10-CM

## 2013-12-27 DIAGNOSIS — I252 Old myocardial infarction: Secondary | ICD-10-CM

## 2013-12-27 HISTORY — DX: Reserved for inherently not codable concepts without codable children: IMO0001

## 2013-12-27 HISTORY — DX: Heart failure, unspecified: I50.9

## 2013-12-27 HISTORY — PX: ANTERIOR LUMBAR FUSION: SHX1170

## 2013-12-27 HISTORY — DX: Procedure and treatment not carried out because of patient's decision for reasons of belief and group pressure: Z53.1

## 2013-12-27 HISTORY — DX: Unspecified osteoarthritis, unspecified site: M19.90

## 2013-12-27 HISTORY — DX: Type 2 diabetes mellitus without complications: E11.9

## 2013-12-27 HISTORY — DX: Other chronic pain: G89.29

## 2013-12-27 HISTORY — DX: Dependence on other enabling machines and devices: Z99.89

## 2013-12-27 HISTORY — DX: Low back pain: M54.5

## 2013-12-27 HISTORY — DX: Low back pain, unspecified: M54.50

## 2013-12-27 HISTORY — PX: ABDOMINAL EXPOSURE: SHX5708

## 2013-12-27 HISTORY — DX: Obstructive sleep apnea (adult) (pediatric): G47.33

## 2013-12-27 LAB — GLUCOSE, CAPILLARY
GLUCOSE-CAPILLARY: 247 mg/dL — AB (ref 70–99)
Glucose-Capillary: 129 mg/dL — ABNORMAL HIGH (ref 70–99)
Glucose-Capillary: 390 mg/dL — ABNORMAL HIGH (ref 70–99)
Glucose-Capillary: 362 mg/dL — ABNORMAL HIGH (ref 70–99)

## 2013-12-27 SURGERY — ANTERIOR LUMBAR FUSION 1 LEVEL
Anesthesia: General | Site: Back

## 2013-12-27 MED ORDER — THROMBIN 5000 UNITS EX SOLR
CUTANEOUS | Status: DC | PRN
  Administered 2013-12-27: 5000 [IU] via TOPICAL

## 2013-12-27 MED ORDER — MENTHOL 3 MG MT LOZG
1.0000 | LOZENGE | OROMUCOSAL | Status: DC | PRN
  Filled 2013-12-27: qty 9

## 2013-12-27 MED ORDER — FUROSEMIDE 20 MG PO TABS
20.0000 mg | ORAL_TABLET | Freq: Every day | ORAL | Status: DC | PRN
  Administered 2013-12-28: 20 mg via ORAL
  Filled 2013-12-27 (×5): qty 1

## 2013-12-27 MED ORDER — INSULIN ASPART 100 UNIT/ML ~~LOC~~ SOLN
0.0000 [IU] | SUBCUTANEOUS | Status: DC
  Administered 2013-12-27 (×2): 15 [IU] via SUBCUTANEOUS
  Administered 2013-12-28: 3 [IU] via SUBCUTANEOUS
  Administered 2013-12-28 (×2): 5 [IU] via SUBCUTANEOUS
  Administered 2013-12-28: 3 [IU] via SUBCUTANEOUS
  Administered 2013-12-28: 5 [IU] via SUBCUTANEOUS
  Administered 2013-12-28: 8 [IU] via SUBCUTANEOUS
  Administered 2013-12-29 (×3): 5 [IU] via SUBCUTANEOUS

## 2013-12-27 MED ORDER — FENTANYL CITRATE 0.05 MG/ML IJ SOLN
INTRAMUSCULAR | Status: DC | PRN
  Administered 2013-12-27 (×7): 50 ug via INTRAVENOUS

## 2013-12-27 MED ORDER — HYDROMORPHONE HCL PF 1 MG/ML IJ SOLN
INTRAMUSCULAR | Status: AC
  Filled 2013-12-27: qty 1

## 2013-12-27 MED ORDER — DIPHENHYDRAMINE HCL 12.5 MG/5ML PO ELIX
12.5000 mg | ORAL_SOLUTION | Freq: Four times a day (QID) | ORAL | Status: DC | PRN
  Filled 2013-12-27: qty 5

## 2013-12-27 MED ORDER — PROPOFOL 10 MG/ML IV BOLUS
INTRAVENOUS | Status: AC
  Filled 2013-12-27: qty 20

## 2013-12-27 MED ORDER — GLYCOPYRROLATE 0.2 MG/ML IJ SOLN
INTRAMUSCULAR | Status: AC
  Filled 2013-12-27: qty 4

## 2013-12-27 MED ORDER — FENTANYL CITRATE 0.05 MG/ML IJ SOLN
INTRAMUSCULAR | Status: AC
  Filled 2013-12-27: qty 5

## 2013-12-27 MED ORDER — PHENOL 1.4 % MT LIQD: 1.0000 | OROMUCOSAL | Status: DC | PRN

## 2013-12-27 MED ORDER — LACTATED RINGERS IV SOLN
INTRAVENOUS | Status: DC | PRN
  Administered 2013-12-27 (×3): via INTRAVENOUS

## 2013-12-27 MED ORDER — SODIUM CHLORIDE 0.9 % IJ SOLN
3.0000 mL | Freq: Two times a day (BID) | INTRAMUSCULAR | Status: DC
  Administered 2013-12-28 – 2013-12-30 (×5): 3 mL via INTRAVENOUS

## 2013-12-27 MED ORDER — ENOXAPARIN SODIUM 40 MG/0.4ML ~~LOC~~ SOLN
40.0000 mg | SUBCUTANEOUS | Status: DC
  Administered 2013-12-28 – 2013-12-31 (×3): 40 mg via SUBCUTANEOUS
  Filled 2013-12-27 (×5): qty 0.4

## 2013-12-27 MED ORDER — CEFAZOLIN SODIUM 1-5 GM-% IV SOLN
1.0000 g | Freq: Three times a day (TID) | INTRAVENOUS | Status: AC
  Administered 2013-12-27 – 2013-12-28 (×2): 1 g via INTRAVENOUS
  Filled 2013-12-27 (×2): qty 50

## 2013-12-27 MED ORDER — NALOXONE HCL 0.4 MG/ML IJ SOLN
0.4000 mg | INTRAMUSCULAR | Status: DC | PRN
  Filled 2013-12-27: qty 1

## 2013-12-27 MED ORDER — NEOSTIGMINE METHYLSULFATE 10 MG/10ML IV SOLN
INTRAVENOUS | Status: DC | PRN
  Administered 2013-12-27: 5 mg via INTRAVENOUS

## 2013-12-27 MED ORDER — ONDANSETRON HCL 4 MG/2ML IJ SOLN
INTRAMUSCULAR | Status: AC
  Filled 2013-12-27: qty 2

## 2013-12-27 MED ORDER — LACTATED RINGERS IV SOLN
INTRAVENOUS | Status: DC
  Administered 2013-12-27 (×2): via INTRAVENOUS

## 2013-12-27 MED ORDER — ALBUMIN HUMAN 5 % IV SOLN
INTRAVENOUS | Status: DC | PRN
  Administered 2013-12-27 (×2): via INTRAVENOUS

## 2013-12-27 MED ORDER — CARVEDILOL 6.25 MG PO TABS
6.2500 mg | ORAL_TABLET | Freq: Once | ORAL | Status: AC
  Administered 2013-12-27: 6.25 mg via ORAL

## 2013-12-27 MED ORDER — CARVEDILOL 3.125 MG PO TABS
ORAL_TABLET | ORAL | Status: AC
  Administered 2013-12-27: 6.25 mg via ORAL
  Filled 2013-12-27: qty 2

## 2013-12-27 MED ORDER — DIPHENHYDRAMINE HCL 50 MG/ML IJ SOLN
12.5000 mg | Freq: Four times a day (QID) | INTRAMUSCULAR | Status: DC | PRN
  Filled 2013-12-27: qty 0.25

## 2013-12-27 MED ORDER — MIDAZOLAM HCL 5 MG/5ML IJ SOLN
INTRAMUSCULAR | Status: DC | PRN
  Administered 2013-12-27: 2 mg via INTRAVENOUS

## 2013-12-27 MED ORDER — MORPHINE SULFATE (PF) 1 MG/ML IV SOLN
INTRAVENOUS | Status: DC
  Administered 2013-12-27: 1 mg via INTRAVENOUS
  Administered 2013-12-27: 2 mg via INTRAVENOUS
  Administered 2013-12-28: 4 mg via INTRAVENOUS
  Administered 2013-12-28: 2 mg via INTRAVENOUS
  Administered 2013-12-28: 08:00:00 via INTRAVENOUS
  Administered 2013-12-28: 3 mg via INTRAVENOUS
  Filled 2013-12-27: qty 25

## 2013-12-27 MED ORDER — CEFAZOLIN SODIUM-DEXTROSE 2-3 GM-% IV SOLR
2.0000 g | INTRAVENOUS | Status: AC
Start: 2013-12-27 — End: 2013-12-27
  Administered 2013-12-27: 2 g via INTRAVENOUS

## 2013-12-27 MED ORDER — ALBUMIN HUMAN 5 % IV SOLN
12.5000 g | Freq: Once | INTRAVENOUS | Status: AC
  Administered 2013-12-27: 12.5 g via INTRAVENOUS

## 2013-12-27 MED ORDER — ROCURONIUM BROMIDE 100 MG/10ML IV SOLN
INTRAVENOUS | Status: DC | PRN
  Administered 2013-12-27: 30 mg via INTRAVENOUS
  Administered 2013-12-27: 20 mg via INTRAVENOUS
  Administered 2013-12-27: 50 mg via INTRAVENOUS

## 2013-12-27 MED ORDER — SODIUM CHLORIDE 0.9 % IJ SOLN: 9.0000 mL | INTRAMUSCULAR | Status: DC | PRN

## 2013-12-27 MED ORDER — PHENYLEPHRINE HCL 10 MG/ML IJ SOLN
10.0000 mg | INTRAVENOUS | Status: DC | PRN
  Administered 2013-12-27: 25 ug/min via INTRAVENOUS

## 2013-12-27 MED ORDER — ONDANSETRON HCL 4 MG/2ML IJ SOLN
INTRAMUSCULAR | Status: DC | PRN
  Administered 2013-12-27: 4 mg via INTRAVENOUS

## 2013-12-27 MED ORDER — HYDROMORPHONE HCL PF 1 MG/ML IJ SOLN
0.2500 mg | INTRAMUSCULAR | Status: DC | PRN
  Administered 2013-12-27 (×4): 0.25 mg via INTRAVENOUS

## 2013-12-27 MED ORDER — LIDOCAINE HCL (CARDIAC) 20 MG/ML IV SOLN
INTRAVENOUS | Status: AC
  Filled 2013-12-27: qty 5

## 2013-12-27 MED ORDER — CARVEDILOL 3.125 MG PO TABS
3.1250 mg | ORAL_TABLET | Freq: Two times a day (BID) | ORAL | Status: DC
  Administered 2013-12-28 – 2013-12-29 (×2): 3.125 mg via ORAL
  Filled 2013-12-27 (×10): qty 1

## 2013-12-27 MED ORDER — HEMOSTATIC AGENTS (NO CHARGE) OPTIME
TOPICAL | Status: DC | PRN
  Administered 2013-12-27: 1 via TOPICAL

## 2013-12-27 MED ORDER — ALBUTEROL SULFATE HFA 108 (90 BASE) MCG/ACT IN AERS: 2.0000 | INHALATION_SPRAY | Freq: Four times a day (QID) | RESPIRATORY_TRACT | Status: DC | PRN

## 2013-12-27 MED ORDER — ONDANSETRON HCL 4 MG/2ML IJ SOLN: 4.0000 mg | INTRAMUSCULAR | Status: DC | PRN

## 2013-12-27 MED ORDER — SODIUM CHLORIDE 0.9 % IV SOLN: 250.0000 mL | INTRAVENOUS | Status: DC

## 2013-12-27 MED ORDER — ALBUMIN HUMAN 5 % IV SOLN
INTRAVENOUS | Status: AC
  Filled 2013-12-27: qty 250

## 2013-12-27 MED ORDER — THROMBIN 20000 UNITS EX SOLR
CUTANEOUS | Status: AC
  Filled 2013-12-27: qty 20000

## 2013-12-27 MED ORDER — MORPHINE SULFATE (PF) 1 MG/ML IV SOLN
INTRAVENOUS | Status: AC
  Administered 2013-12-27: 4 mg
  Filled 2013-12-27: qty 25

## 2013-12-27 MED ORDER — NEOSTIGMINE METHYLSULFATE 10 MG/10ML IV SOLN
INTRAVENOUS | Status: AC
  Filled 2013-12-27: qty 1

## 2013-12-27 MED ORDER — ACETAMINOPHEN 10 MG/ML IV SOLN: 1000.0000 mg | Freq: Four times a day (QID) | INTRAVENOUS | Status: DC

## 2013-12-27 MED ORDER — MIDAZOLAM HCL 2 MG/2ML IJ SOLN
INTRAMUSCULAR | Status: AC
  Filled 2013-12-27: qty 2

## 2013-12-27 MED ORDER — PROPOFOL 10 MG/ML IV BOLUS
INTRAVENOUS | Status: DC | PRN
  Administered 2013-12-27: 200 mg via INTRAVENOUS

## 2013-12-27 MED ORDER — ROCURONIUM BROMIDE 50 MG/5ML IV SOLN
INTRAVENOUS | Status: AC
  Filled 2013-12-27: qty 1

## 2013-12-27 MED ORDER — VECURONIUM BROMIDE 10 MG IV SOLR
INTRAVENOUS | Status: DC | PRN
  Administered 2013-12-27 (×4): 2 mg via INTRAVENOUS

## 2013-12-27 MED ORDER — DEXAMETHASONE SODIUM PHOSPHATE 4 MG/ML IJ SOLN
INTRAMUSCULAR | Status: DC | PRN
  Administered 2013-12-27: 4 mg via INTRAVENOUS

## 2013-12-27 MED ORDER — THROMBIN 5000 UNITS EX SOLR
CUTANEOUS | Status: AC
  Filled 2013-12-27: qty 5000

## 2013-12-27 MED ORDER — FLUOXETINE HCL 20 MG PO CAPS
20.0000 mg | ORAL_CAPSULE | Freq: Every day | ORAL | Status: DC
  Administered 2013-12-28 – 2013-12-31 (×4): 20 mg via ORAL
  Filled 2013-12-27 (×4): qty 1

## 2013-12-27 MED ORDER — METHOCARBAMOL 500 MG PO TABS
500.0000 mg | ORAL_TABLET | Freq: Four times a day (QID) | ORAL | Status: DC | PRN
  Administered 2013-12-28 – 2013-12-31 (×5): 500 mg via ORAL
  Filled 2013-12-27 (×5): qty 1

## 2013-12-27 MED ORDER — METHOCARBAMOL 1000 MG/10ML IJ SOLN
500.0000 mg | Freq: Four times a day (QID) | INTRAVENOUS | Status: DC | PRN
  Filled 2013-12-27: qty 5

## 2013-12-27 MED ORDER — SODIUM CHLORIDE 0.9 % IJ SOLN: 3.0000 mL | INTRAMUSCULAR | Status: DC | PRN

## 2013-12-27 MED ORDER — OXYCODONE HCL 5 MG PO TABS
10.0000 mg | ORAL_TABLET | ORAL | Status: DC | PRN
  Administered 2013-12-28 – 2013-12-31 (×13): 10 mg via ORAL
  Filled 2013-12-27 (×13): qty 2

## 2013-12-27 MED ORDER — ONDANSETRON HCL 4 MG/2ML IJ SOLN: 4.0000 mg | Freq: Four times a day (QID) | INTRAMUSCULAR | Status: DC | PRN

## 2013-12-27 MED ORDER — LIDOCAINE HCL (CARDIAC) 20 MG/ML IV SOLN
INTRAVENOUS | Status: DC | PRN
  Administered 2013-12-27: 80 mg via INTRAVENOUS

## 2013-12-27 MED ORDER — GLYCOPYRROLATE 0.2 MG/ML IJ SOLN
INTRAMUSCULAR | Status: DC | PRN
  Administered 2013-12-27: .8 mg via INTRAVENOUS

## 2013-12-27 SURGICAL SUPPLY — 91 items
APPLIER CLIP 11 MED OPEN (CLIP) ×8
BLADE 10 SAFETY STRL DISP (BLADE) ×8 IMPLANT
BLADE SURG 10 STRL SS (BLADE) ×4 IMPLANT
BLADE SURG ROTATE 9660 (MISCELLANEOUS) IMPLANT
CLIP APPLIE 11 MED OPEN (CLIP) ×4 IMPLANT
CLOSURE STERI-STRIP 1/2X4 (GAUZE/BANDAGES/DRESSINGS) ×1
CLOSURE WOUND 1/2 X4 (GAUZE/BANDAGES/DRESSINGS)
CLSR STERI-STRIP ANTIMIC 1/2X4 (GAUZE/BANDAGES/DRESSINGS) ×3 IMPLANT
CORDS BIPOLAR (ELECTRODE) ×4 IMPLANT
COVER SURGICAL LIGHT HANDLE (MISCELLANEOUS) ×8 IMPLANT
COVER TABLE BACK 60X90 (DRAPES) ×4 IMPLANT
DERMABOND ADVANCED (GAUZE/BANDAGES/DRESSINGS) ×4
DERMABOND ADVANCED .7 DNX12 (GAUZE/BANDAGES/DRESSINGS) ×4 IMPLANT
DRAPE C-ARM 42X72 X-RAY (DRAPES) ×8 IMPLANT
DRAPE INCISE IOBAN 66X45 STRL (DRAPES) ×4 IMPLANT
DRAPE POUCH INSTRU U-SHP 10X18 (DRAPES) ×4 IMPLANT
DRAPE SURG 17X23 STRL (DRAPES) ×4 IMPLANT
DRAPE U-SHAPE 47X51 STRL (DRAPES) ×4 IMPLANT
DRSG MEPILEX BORDER 4X8 (GAUZE/BANDAGES/DRESSINGS) ×4 IMPLANT
DURAPREP 26ML APPLICATOR (WOUND CARE) ×4 IMPLANT
ELECT BLADE 4.0 EZ CLEAN MEGAD (MISCELLANEOUS) ×4
ELECT CAUTERY BLADE 6.4 (BLADE) ×4 IMPLANT
ELECT REM PT RETURN 9FT ADLT (ELECTROSURGICAL) ×4
ELECTRODE BLDE 4.0 EZ CLN MEGD (MISCELLANEOUS) ×2 IMPLANT
ELECTRODE REM PT RTRN 9FT ADLT (ELECTROSURGICAL) ×2 IMPLANT
GAUZE SPONGE 4X4 16PLY XRAY LF (GAUZE/BANDAGES/DRESSINGS) IMPLANT
GLOVE BIOGEL PI IND STRL 8 (GLOVE) ×4 IMPLANT
GLOVE BIOGEL PI IND STRL 8.5 (GLOVE) ×4 IMPLANT
GLOVE BIOGEL PI INDICATOR 8 (GLOVE) ×4
GLOVE BIOGEL PI INDICATOR 8.5 (GLOVE) ×4
GLOVE ECLIPSE 7.5 STRL STRAW (GLOVE) ×4 IMPLANT
GLOVE ECLIPSE 8.5 STRL (GLOVE) ×8 IMPLANT
GLOVE ORTHO TXT STRL SZ7.5 (GLOVE) ×4 IMPLANT
GOWN STRL REUS W/ TWL LRG LVL3 (GOWN DISPOSABLE) ×8 IMPLANT
GOWN STRL REUS W/TWL 2XL LVL3 (GOWN DISPOSABLE) ×12 IMPLANT
GOWN STRL REUS W/TWL LRG LVL3 (GOWN DISPOSABLE) ×8
GRANULES STERILE 5CC (Bone Implant) ×4 IMPLANT
HEMOSTAT SNOW SURGICEL 2X4 (HEMOSTASIS) IMPLANT
INSERT FOGARTY 61MM (MISCELLANEOUS) IMPLANT
INSERT FOGARTY SM (MISCELLANEOUS) IMPLANT
INTERPLATE 39X14X8 (Plate) ×4 IMPLANT
KIT BASIN OR (CUSTOM PROCEDURE TRAY) ×4 IMPLANT
KIT ROOM TURNOVER OR (KITS) ×8 IMPLANT
LOOP VESSEL MAXI BLUE (MISCELLANEOUS) ×4 IMPLANT
LOOP VESSEL MINI RED (MISCELLANEOUS) ×4 IMPLANT
NEEDLE ASP BONE MRW 11GX15 J (NEEDLE) ×4 IMPLANT
NEEDLE SPNL 18GX3.5 QUINCKE PK (NEEDLE) ×4 IMPLANT
NS IRRIG 1000ML POUR BTL (IV SOLUTION) ×4 IMPLANT
PACK LAMINECTOMY ORTHO (CUSTOM PROCEDURE TRAY) ×4 IMPLANT
PACK UNIVERSAL I (CUSTOM PROCEDURE TRAY) ×4 IMPLANT
PAD ARMBOARD 7.5X6 YLW CONV (MISCELLANEOUS) ×16 IMPLANT
PEEK SPACER INTERPLAT 35X14X8 (Peek) ×4 IMPLANT
PLATE SPINAL INTERPLAT 39X14X8 (Plate) ×2 IMPLANT
PUTTY NOVABONE 2.5CC (Orthopedic Implant) ×4 IMPLANT
SCREW BONE STANDARD (Screw) ×4 IMPLANT
SCREW BONE STD (Screw) ×2 IMPLANT
SCREW SPINAL STD (Orthopedic Implant) ×4 IMPLANT
SPONGE INTESTINAL PEANUT (DISPOSABLE) ×16 IMPLANT
SPONGE LAP 18X18 X RAY DECT (DISPOSABLE) IMPLANT
SPONGE LAP 4X18 X RAY DECT (DISPOSABLE) IMPLANT
SPONGE SURGIFOAM ABS GEL 100 (HEMOSTASIS) ×4 IMPLANT
STAPLER VISISTAT 35W (STAPLE) IMPLANT
STRIP CLOSURE SKIN 1/2X4 (GAUZE/BANDAGES/DRESSINGS) IMPLANT
SURGIFLO TRUKIT (HEMOSTASIS) ×4 IMPLANT
SUT MON AB 3-0 SH 27 (SUTURE) ×2
SUT MON AB 3-0 SH27 (SUTURE) ×2 IMPLANT
SUT PDS AB 1 CTX 36 (SUTURE) ×4 IMPLANT
SUT PROLENE 4 0 RB 1 (SUTURE) ×8
SUT PROLENE 4-0 RB1 .5 CRCL 36 (SUTURE) ×8 IMPLANT
SUT PROLENE 5 0 C 1 24 (SUTURE) IMPLANT
SUT PROLENE 5 0 CC1 (SUTURE) IMPLANT
SUT PROLENE 6 0 C 1 30 (SUTURE) ×4 IMPLANT
SUT PROLENE 6 0 CC (SUTURE) IMPLANT
SUT SILK 0 TIES 10X30 (SUTURE) ×4 IMPLANT
SUT SILK 2 0 TIES 10X30 (SUTURE) ×8 IMPLANT
SUT SILK 2 0SH CR/8 30 (SUTURE) IMPLANT
SUT SILK 3 0 TIES 10X30 (SUTURE) ×8 IMPLANT
SUT SILK 3 0SH CR/8 30 (SUTURE) IMPLANT
SUT VIC AB 0 CT1 27 (SUTURE) ×2
SUT VIC AB 0 CT1 27XBRD ANBCTR (SUTURE) ×2 IMPLANT
SUT VIC AB 1 CT1 27 (SUTURE) ×4
SUT VIC AB 1 CT1 27XBRD ANBCTR (SUTURE) ×4 IMPLANT
SUT VIC AB 2-0 CT1 18 (SUTURE) ×4 IMPLANT
SUT VIC AB 2-0 CTB1 (SUTURE) ×4 IMPLANT
SUT VIC AB 3-0 SH 27 (SUTURE) ×2
SUT VIC AB 3-0 SH 27X BRD (SUTURE) ×2 IMPLANT
SYR BULB IRRIGATION 50ML (SYRINGE) ×4 IMPLANT
TOWEL OR 17X24 6PK STRL BLUE (TOWEL DISPOSABLE) ×8 IMPLANT
TOWEL OR 17X26 10 PK STRL BLUE (TOWEL DISPOSABLE) ×8 IMPLANT
TRAY FOLEY CATH 14FRSI W/METER (CATHETERS) ×4 IMPLANT
WATER STERILE IRR 1000ML POUR (IV SOLUTION) ×4 IMPLANT

## 2013-12-27 NOTE — Interval H&P Note (Signed)
History and Physical Interval Note:  12/27/2013 7:16 AM  Eugene [name]  has presented today for surgery, with the diagnosis of L5-S1 DEGENERATIVE DISEASE  The various methods of treatment have been discussed with the patient and family. After consideration of risks, benefits and other options for treatment, the patient has consented to  Procedure(s): A LIF L5-S1 /ANTERIOR LUMBAR INTERBODY FUSION (LEVEL 1) (N/A) ABDOMINAL EXPOSURE (N/A) as a surgical intervention .  The patient's history has been reviewed, patient examined, no change in status, stable for surgery.  I have reviewed the patient's chart and labs.  Questions were answered to the patient's satisfaction.     [name]

## 2013-12-27 NOTE — Op Note (Addendum)
  NAME: Eugene [name]  DOB: 04/19/1948 DATE OF OPERATION: 12/27/2013  PREOP DIAGNOSIS: Degenerative disc disease at L5-S1  POSTOP DIAGNOSIS: Same  PROCEDURE: Anterior retroperitoneal exposure of L5-S1  CO-SURGEONS: Judeth Cornfield. Scot Dock, MD, FACS (exposure) Melina Schools MD (ALIF)  ASSIST: Jeneen Rinks [facility]  ANESTHESIA: Gen.  EBL: minimal  INDICATIONS: [name] is a 63 y.o. male with significant degenerative disc disease at L5-S1. I was asked to provide anterior retroperitoneal exposure of L5-S1. 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  FINDINGS: significant degenerative disc disease L5-S1 with a large osteophyte.  TECHNIQUE: The patient was taken to the operating room and received a general anesthetic. The abdomen was prepped and draped in usual sterile fashion. The level of the L5-S1 disc was marked under fluoroscopy. An incision was made to the left of the midline in a transverse fashion at the marked level. The dissection was carried down to the anterior rectus sheath which was divided transversely. The anterior rectus sheath was mobilized superiorly and inferiorly and then the rectus abdominis muscle mobilized circumferentially. Initially the muscle was retracted medially. The retroperitoneal space was entered. Dissection carried down to the psoas and the external iliac and common iliac arteries were identified. Blunt dissection was continued medially to this with the ureter mobilized to the patient's right. This allowed exposure of the sacral promontory. The middle sacral vessels were divided between clips and cauterized. The L5-S1 disc space was exposed using blunt dissection such that a reverse lift retractor could be placed on the right side of the vertebral body and then on the left side to allow adequate exposure of L5-S1. Intraoperative x-ray was obtained to confirm that we were at the correct location. The remainder of the dictation is as per Dr. Rolena Infante.  Deitra Mayo, MD, FACS    [facility]  DATE OF DICTATION: 12/27/2013

## 2013-12-27 NOTE — Anesthesia Preprocedure Evaluation (Addendum)
Anesthesia Evaluation  Patient identified by MRN, date of birth, ID band Patient awake    Reviewed: Allergy & Precautions, H&P , NPO status , Patient's Chart, lab work & pertinent test results  Airway Mallampati: II TM Distance: >3 FB Neck ROM: Full    Dental   Pulmonary sleep apnea and Continuous Positive Airway Pressure Ventilation , pneumonia -, former smoker,  breath sounds clear to auscultation        Cardiovascular + Past MI Rhythm:Regular Rate:Normal     Neuro/Psych Anxiety    GI/Hepatic GERD-  Medicated and Controlled,  Endo/Other  diabetes, Type 2  Renal/GU      Musculoskeletal   Abdominal   Peds  Hematology  (+) J[facility]OVAH'S WITNESS  Anesthesia Other Findings   Reproductive/Obstetrics                        Anesthesia Physical Anesthesia Plan  ASA: III  Anesthesia Plan: General   Post-op Pain Management:    Induction: Intravenous  Airway Management Planned: Oral ETT  Additional Equipment:   Intra-op Plan:   Post-operative Plan: Possible Post-op intubation/ventilation  Informed Consent: I have reviewed the patients History and Physical, chart, labs and discussed the procedure including the risks, benefits and alternatives for the proposed anesthesia with the patient or authorized representative who has indicated his/her understanding and acceptance.   Dental advisory given  Plan Discussed with: CRNA, Anesthesiologist and Surgeon  Anesthesia Plan Comments:        Anesthesia Quick Evaluation

## 2013-12-27 NOTE — Addendum Note (Signed)
Addendum created 12/27/13 1408 by Clearnce Sorrel, CRNA   Modules edited: Anesthesia Medication Administration

## 2013-12-27 NOTE — Progress Notes (Signed)
Utilization review completed.  

## 2013-12-27 NOTE — Brief Op Note (Signed)
12/27/2013  11:19 AM  PATIENT:  Eugene [name]  63 y.o. male  PRE-OPERATIVE DIAGNOSIS:  L5-S1 DEGENERATIVE DISEASE  POST-OPERATIVE DIAGNOSIS:  L5-S1 DEGENERATIVE DISEASE  PROCEDURE:  Procedure(s): A LIF L5-S1 /ANTERIOR LUMBAR INTERBODY FUSION (LEVEL 1) (N/A) ABDOMINAL EXPOSURE (N/A)  SURGEON:  Surgeon(s) and Role: Panel 1:    * Melina Schools, MD - Primary  Panel 2:    * [name], MD - Primary  PHYSICIAN ASSISTANT:   ASSISTANTS: Benjiman Core (PA) Co-Surgeon: Dr Scot Dock (vascular surgeon)   ANESTHESIA:   general  EBL:  Total I/O In: 1500 [I.V.:1000; IV Piggyback:500] Out: 750 [Urine:550; Blood:200]2  BLOOD ADMINISTERED:none  DRAINS: none   LOCAL MEDICATIONS USED:  NONE  SPECIMEN:  No Specimen  DISPOSITION OF SPECIMEN:  N/A  COUNTS:  YES  TOURNIQUET:  * No tourniquets in log *  DICTATION: .Other Dictation: Dictation Number [id number]  PLAN OF CARE: Admit to inpatient   PATIENT DISPOSITION:  PACU - hemodynamically stable.

## 2013-12-27 NOTE — H&P (View-Only) (Signed)
[facility] [place]  Patient name: Eugene [name] MRN: [id number] DOB: 1951/04/08 Sex: male  REASON FOR CONSULT: Evaluate for ALIF. Referred by Dr. Rolena Infante.  HPI: Eugene [name] is a 63 y.o. male who is had low back pain for 2-3 years. This came on gradually and is fairly constant. Approximately a year ago he fell down some steps in this pain became significantly worse. He has disabling low back pain which has failed conservative treatment. He's had injection therapy and physical therapy but continues to have significant pain. He was evaluated and felt to be a candidate for ALIF at the L5-S1 level. We were consult to evaluate him for retroperitoneal exposure of this disc level.   Past Medical History  Diagnosis Date  . Diabetes   . Sleep apnea   . MI (myocardial infarction) 2011  . Anxiety   . Heart murmur     "not to be concerned"  . Pneumonia 2014    had wheezing with this and was giving the inhaler  . GERD (gastroesophageal reflux disease)   . CLL (chronic lymphocytic leukemia)   . Sickle cell trait   . DDD (degenerative disc disease)    History reviewed. No pertinent family history.  SOCIAL HISTORY: History  Substance Use Topics  . Smoking status: Former Smoker -- 10 years    Types: Cigarettes  . Smokeless tobacco: Never Used  . Alcohol Use: No   Allergies  Allergen Reactions  . Other Other (See Comments)    Mussels- no other seafood.  "Foams at the mouth, slurred speech.   Current Outpatient Prescriptions  Medication Sig Dispense Refill  . acetaminophen (TYLENOL) 500 MG tablet Take 1,000 mg by mouth every 8 (eight) hours as needed for mild pain. Takes with oxycodone.      Marland Kitchen albuterol (PROVENTIL HFA;VENTOLIN HFA) 108 (90 BASE) MCG/ACT inhaler Inhale 2 puffs into the lungs every 6 (six) hours as needed for wheezing.      Marland Kitchen aspirin 325 MG tablet Take 325 mg by mouth daily.      . carvedilol (COREG) 6.25 MG tablet Take 3.125 mg by mouth 2 (two)  times daily.      . clindamycin (CLEOCIN T) 1 % SWAB Apply 1 application topically daily.      Marland Kitchen dextrose 4 G chewable tablet Chew 16 g by mouth as needed for low blood sugar. Repeat every 15 minutes if blood sugar is less than 70.      . fexofenadine-pseudoephedrine (ALLEGRA-D 12 HOUR) 60-120 MG per tablet Take 1 tablet by mouth 2 (two) times daily as needed (Allergies).      Marland Kitchen FLUoxetine (PROZAC) 20 MG capsule Take 20 mg by mouth daily.      . furosemide (LASIX) 20 MG tablet Take 20 mg by mouth daily as needed (Diuretic).      Marland Kitchen gabapentin (NEURONTIN) 300 MG capsule Take 300 mg by mouth 3 (three) times daily.      . Insulin Human (INSULIN PUMP) 100 unit/ml SOLN Inject into the skin continuous. novolog      . metFORMIN (GLUCOPHAGE) 1000 MG tablet Take 1,000 mg by mouth 2 (two) times daily.      . Multiple Vitamin (MULTIVITAMIN WITH MINERALS) TABS Take 1 tablet by mouth daily.      . NON FORMULARY CPAP      . omega-3 acid ethyl esters (LOVAZA) 1 G capsule Take 2 g by mouth 2 (two) times daily.      Marland Kitchen omeprazole (  PRILOSEC) 20 MG capsule Take 20 mg by mouth daily as needed (acid reflux).      Marland Kitchen OVER THE COUNTER MEDICATION Take 5 mg by mouth 3 (three) times daily. 'Brain toner'--herbal supplement strengthens nerves in brain      . oxycodone (OXY-IR) 5 MG capsule Take 5 mg by mouth 2 (two) times daily as needed for pain. Takes with tylenol      . sildenafil (VIAGRA) 100 MG tablet Take 100 mg by mouth daily as needed for erectile dysfunction.       No current facility-administered medications for this visit.   REVIEW OF SYSTEMS: Valu.Nieves ] denotes positive finding; [  ] denotes negative finding  CARDIOVASCULAR:  [ ]  chest pain   [ ]  chest pressure   [ ]  palpitations   [ ]  orthopnea   [ ]  dyspnea on exertion   [ ]  claudication   [ ]  rest pain   [ ]  DVT   [ ]  phlebitis PULMONARY:   [ ]  productive cough   [ ]  asthma   [ ]  wheezing NEUROLOGIC:   [ ]  weakness  [ ]  paresthesias  [ ]  aphasia  [ ]  amaurosis  [ ]   dizziness HEMATOLOGIC:   [ ]  bleeding problems   [ ]  clotting disorders MUSCULOSKELETAL:  [ ]  joint pain   [ ]  joint swelling [ ]  leg swelling GASTROINTESTINAL: [ ]   blood in stool  [ ]   hematemesis GENITOURINARY:  [ ]   dysuria  [ ]   hematuria PSYCHIATRIC:  [ ]  history of major depression INTEGUMENTARY:  [ ]  rashes  [ ]  ulcers CONSTITUTIONAL:  [ ]  fever   [ ]  chills  PHYSICAL EXAM: Filed Vitals:   12/26/13 0907  BP: 125/74  Pulse: 87  Height: 6' (1.829 m)  Weight: 201 lb 9.6 oz (91.445 kg)  SpO2: 99%   Body mass index is 27.34 kg/(m^2). GENERAL: The patient is a well-nourished male, in no acute distress. The vital signs are documented above. CARDIOVASCULAR: There is a regular rate and rhythm. I do not detect carotid bruits. He has palpable femoral pulses and palpable dorsalis pedis pulses bilaterally. I cannot palpate posterior tibial pulses. PULMONARY: There is good air exchange bilaterally without wheezing or rales. ABDOMEN: Soft and non-tender with normal pitched bowel sounds.  MUSCULOSKELETAL: There are no major deformities or cyanosis. NEUROLOGIC: No focal weakness or paresthesias are detected. SKIN: There are no ulcers or rashes noted. PSYCHIATRIC: The patient has a normal affect.  DATA:  I reviewed his lumbar spine films from 2014. He has significant degenerative disc disease L5-S1.  Get an MRI yesterday and I do not have these results currently.  MEDICAL ISSUES:  DDD (degenerative disc disease), lumbosacral The patient appears to be a reasonable candidate for anterior retroperitoneal exposure of L5-S1. I have reviewed our role in exposure of the spine in order to allow anterior lumbar interbody fusion at the appropriate levels. We have discussed the potential complications of surgery, including but not limited to, arterial or venous injury, thrombosis, or bleeding. We have also discussed the potential risks of wound healing problems, the development of a hernia, nerve  injury, leg swelling, or other unpredictable medical problems. I've also explained that for the L5-S1 level there is a small risk of retrograde ejaculation. All the patient's questions were answered and they are agreeable to proceed. The surgery is scheduled for tomorrow.   [name] [facility] [place] Beeper: [phone]

## 2013-12-27 NOTE — Plan of Care (Signed)
Problem: Consults Goal: Diagnosis - Spinal Surgery Lumbar Laminectomy (Complex) ALIF L5-S1

## 2013-12-27 NOTE — H&P (Signed)
History of Present Illness  The patient is a 63 year old male who presents today for follow up of their back. The patient is being followed for their central back pain. They are now 2 month(s) out from last visit. Symptoms reported today include: pain (lower lumbar radiating into bilat. lower ext. to level of the mid/lateral calf into bilateral feet, sharp pains), pain at night, weakness (bilat. lower ext. at times, with right greater than left ) and numbness (bilat. feet ), while the patient does not report symptoms of: urinary incontinence. The patient states that they are doing poorly. The following medication has been used for pain control: none. The patient reports their current pain level to be 8 / 10   The patient returns today for follow up. I have received the notation from his hematologist Dr. Dellis Filbert at [facility]. He indicates that he has CLL, but it does not require therapy. He did have a bone marrow biopsy and the physician did not feel as though there was any contraindication to proceeding with the spinal fusion. He was set up to return to see the hematologist in three months. We are still pending his clearance from his cardiologist.   The patient is a very pleasant gentleman who continues to have significant low back pain. Despite physical therapy, injection therapy, activity modifications, narcotic and nonnarcotic medications his quality of life continues to suffer and his ability to return to gainful employment continues to be hindered.    Allergies No Known Drug Allergies.    Social History Exercise. Exercises weekly; does running / walking and gym / weights Alcohol use. current drinker; only occasionally per week Children. 4 Tobacco use. Former smoker. former smoker; smoke(d) 1 pack(s) per day Drug/Alcohol Rehab (Currently). no Drug/Alcohol Rehab (Previously). no Current work status. working full time Living situation. live with spouse Marital  status. married Number of flights of stairs before winded. 4-5 Illicit drug use. no Pain Contract. no Tobacco / smoke exposure. no    Medication History Voltaren (1% Gel, 2-4 gram(s) Transdermal four times daily, Taken starting 05/04/2013) Active. (apply to affected area, wait one hour before washing) Lidoderm (5% Patch, 1 (one) Patch External apply every 12 hours, Taken starting 07/09/2013) Active. Proventil HFA ( Inhalation) Specific dose unknown - Active. Viagra ( Oral) Specific dose unknown - Active. Carvedilol ( Oral) Specific dose unknown - Active. Clindamycin Phosphate ( External) Specific dose unknown - Active. Furosemide ( Oral) Specific dose unknown - Active. Gabapentin ( Oral) Specific dose unknown - Active. Lantus ( Subcutaneous) Specific dose unknown - Active. Lovaza ( Oral) Specific dose unknown - Active. MetFORMIN HCl ( Oral) Specific dose unknown - Active. Medications Reconciled.    Past Surgical History Rotator Cuff Repair. right    Other Problems Diabetes Mellitus, Type II Myocardial infarction    Objective Transcription  On physical exam he is a pleasant gentleman who appears his stated age in no acute distress. He is alert and oriented times three. No shortness of breath or chest pain. The abdomen is soft and nontender. He has significant back pain with palpation and range of motion. No obvious skin lesions, abrasions, or contusion. Compartments are soft and nontender. Intact peripheral pulses. Negative Babinski test. No clonus.    RADIOGRAPHS:  At this point in time I have reviewed his MRI and plain x rays. Repeat MRI done 4/29 show no significant changes.  Mild degenerative changes noted at L4/5 but no significant stenosis or neural compression.  DDD with stenosis L5/S1.  Consistent with xray findings      Plans Transcription  I do believe proceeding with the anterior lumbar interbody fusion provides [place] the best  opportunity to resolve his significant back pain. We have, again, going over the procedure including descriptions of the procedure. The risks of that include infection, bleeding, nerve damage, death, stoke, paralysis, failure to heal, need for further surgery, ongoing or worse pain, injury to the bowel and bladder, loss of bowel and bladder control, nonunion, infection, retrograde ejaculation, blood clots, adjacent segment disease. My hope is that he is in the hospital for two to three nights and then he will go home with a total of ten days of Lovenox and then starting his aspirin. He will probably be out of work for three months and then gradual increase in his activities over the next three months. I would expect him to be at [facility] between nine to twelve months when he has a solid fusion. Due to the underlying diabetes and the effect that has on fusion rates, I would request also an external bone stimulator to be used during the postoperative healing. Again, we talked about the fact that he is a Jehov[employer] Witness and we will not use any blood products and we will not use cell saver. I will make sure the anesthesiologist is aware of that and so appropriate measures can be taken. Once we have clearance form his cardiologist we will obtain a date and then proceed from there. After surgery he will also need a three in one shower chair, a grabber, and a walker. If there are any other durable medical goods that he requires we will require that during the postoperative period while he is hospitalized.  Obtained clearance from PCP

## 2013-12-27 NOTE — Transfer of Care (Signed)
Immediate Anesthesia Transfer of Care Note  Patient: Eugene [name]  Procedure(s) Performed: Procedure(s): A LIF L5-S1 /ANTERIOR LUMBAR INTERBODY FUSION (LEVEL 1) (N/A) ABDOMINAL EXPOSURE (N/A)  Patient Location: PACU  Anesthesia Type:General  Level of Consciousness: awake, alert  and oriented  Airway & Oxygen Therapy: Patient Spontanous Breathing  Post-op Assessment: Report given to PACU RN  Post vital signs: Reviewed and stable  Complications: No apparent anesthesia complications

## 2013-12-27 NOTE — Progress Notes (Signed)
   VASCULAR PROGRESS NOTE  SUBJECTIVE: Comfortable  PHYSICAL EXAM: Filed Vitals:   12/27/13 1502 12/27/13 1515 12/27/13 1517 12/27/13 1530  BP: 105/59  111/64   Pulse: 84 85 89   Temp:    98 F (36.7 C)  TempSrc:      Resp: 11 14 23    Height:      Weight:      SpO2: 90% 95% 91%    Palp DP pulse on left  LABS: Lab Results  Component Value Date   WBC 11.1* 12/21/2013   HGB 11.8* 12/21/2013   HCT 34.7* 12/21/2013   MCV 74.5* 12/21/2013   PLT 188 12/21/2013   Lab Results  Component Value Date   CREATININE 1.42* 12/21/2013   Lab Results  Component Value Date   INR 1.07 03/27/2010   CBG (last 3)   Recent Labs  12/27/13 0637 12/27/13 1203  GLUCAP 129* 247*    Active Problems:   Back pain   ASSESSMENT AND PLAN:  * Stable post op.  Gae Gallop Beeper: [phone] 12/27/2013

## 2013-12-27 NOTE — Anesthesia Postprocedure Evaluation (Signed)
  Anesthesia Post-op Note  Patient: Eugene [name]  Procedure(s) Performed: Procedure(s): A LIF L5-S1 /ANTERIOR LUMBAR INTERBODY FUSION (LEVEL 1) (N/A) ABDOMINAL EXPOSURE (N/A)  Patient Location: PACU  Anesthesia Type:General  Level of Consciousness: awake  Airway and Oxygen Therapy: Patient Spontanous Breathing  Post-op Pain: mild  Post-op Assessment: Post-op Vital signs reviewed and Patient's Cardiovascular Status Stable  Post-op Vital Signs: Reviewed  Last Vitals:  Filed Vitals:   12/27/13 1201  BP:   Pulse:   Temp: 37.1 C  Resp:     Complications: No apparent anesthesia complications

## 2013-12-28 ENCOUNTER — Inpatient Hospital Stay (HOSPITAL_COMMUNITY): Payer: Worker's Compensation

## 2013-12-28 ENCOUNTER — Encounter (HOSPITAL_COMMUNITY): Payer: Self-pay | Admitting: Orthopedic Surgery

## 2013-12-28 DIAGNOSIS — M79609 Pain in unspecified limb: Secondary | ICD-10-CM

## 2013-12-28 LAB — BASIC METABOLIC PANEL
BUN: 27 mg/dL — AB (ref 6–23)
CO2: 23 mEq/L (ref 19–32)
CREATININE: 1.3 mg/dL (ref 0.50–1.35)
Calcium: 8.8 mg/dL (ref 8.4–10.5)
Chloride: 101 mEq/L (ref 96–112)
GFR, EST AFRICAN AMERICAN: 66 mL/min — AB (ref >90)
GFR, EST NON AFRICAN AMERICAN: 57 mL/min — AB (ref >90)
Glucose, Bld: 173 mg/dL — ABNORMAL HIGH (ref 70–99)
POTASSIUM: 4.7 meq/L (ref 3.7–5.3)
Sodium: 139 mEq/L (ref 137–147)

## 2013-12-28 LAB — CBC
HCT: 29.4 % — ABNORMAL LOW (ref 39.0–52.0)
Hemoglobin: 9.7 g/dL — ABNORMAL LOW (ref 13.0–17.0)
MCH: 24.7 pg — ABNORMAL LOW (ref 26.0–34.0)
MCHC: 33 g/dL (ref 30.0–36.0)
MCV: 75 fL — AB (ref 78.0–100.0)
Platelets: 188 10*3/uL (ref 150–400)
RBC: 3.92 MIL/uL — ABNORMAL LOW (ref 4.22–5.81)
RDW: 17 % — AB (ref 11.5–15.5)
WBC: 13 10*3/uL — AB (ref 4.0–10.5)

## 2013-12-28 LAB — GLUCOSE, CAPILLARY
GLUCOSE-CAPILLARY: 202 mg/dL — AB (ref 70–99)
GLUCOSE-CAPILLARY: 254 mg/dL — AB (ref 70–99)
Glucose-Capillary: 151 mg/dL — ABNORMAL HIGH (ref 70–99)
Glucose-Capillary: 198 mg/dL — ABNORMAL HIGH (ref 70–99)
Glucose-Capillary: 211 mg/dL — ABNORMAL HIGH (ref 70–99)
Glucose-Capillary: 239 mg/dL — ABNORMAL HIGH (ref 70–99)

## 2013-12-28 LAB — HEMOGLOBIN A1C
Hgb A1c MFr Bld: 8.7 % — ABNORMAL HIGH (ref <5.7)
MEAN PLASMA GLUCOSE: 203 mg/dL — AB (ref <117)

## 2013-12-28 MED ORDER — MAGNESIUM CITRATE PO SOLN
0.5000 | Freq: Once | ORAL | Status: AC
Start: 2013-12-28 — End: 2013-12-28
  Administered 2013-12-28: 0.5 via ORAL
  Filled 2013-12-28: qty 296

## 2013-12-28 MED ORDER — POLYETHYLENE GLYCOL 3350 17 G PO PACK: 17.0000 g | PACK | Freq: Every day | ORAL | Status: AC

## 2013-12-28 MED ORDER — INSULIN GLARGINE 100 UNIT/ML ~~LOC~~ SOLN
25.0000 [IU] | Freq: Once | SUBCUTANEOUS | Status: AC
  Administered 2013-12-28: 25 [IU] via SUBCUTANEOUS
  Filled 2013-12-28: qty 0.25

## 2013-12-28 MED ORDER — ENOXAPARIN SODIUM 40 MG/0.4ML ~~LOC~~ SOLN: 40.0000 mg | SUBCUTANEOUS | Status: AC

## 2013-12-28 MED ORDER — OXYCODONE-ACETAMINOPHEN 10-325 MG PO TABS: 1.0000 | ORAL_TABLET | ORAL | Status: AC | PRN

## 2013-12-28 MED ORDER — BISACODYL 10 MG RE SUPP
10.0000 mg | Freq: Once | RECTAL | Status: AC
  Administered 2013-12-28: 10 mg via RECTAL
  Filled 2013-12-28: qty 1

## 2013-12-28 MED ORDER — DOCUSATE SODIUM 100 MG PO CAPS: 100.0000 mg | ORAL_CAPSULE | Freq: Two times a day (BID) | ORAL | Status: AC

## 2013-12-28 MED ORDER — METHOCARBAMOL 500 MG PO TABS: 500.0000 mg | ORAL_TABLET | Freq: Four times a day (QID) | ORAL | Status: AC | PRN

## 2013-12-28 MED ORDER — INSULIN PUMP
Freq: Three times a day (TID) | SUBCUTANEOUS | Status: DC
Start: 2013-12-29 — End: 2013-12-31
  Administered 2013-12-29 (×2): via SUBCUTANEOUS
  Administered 2013-12-29: 2.45 via SUBCUTANEOUS
  Administered 2013-12-30: 8.05 via SUBCUTANEOUS
  Administered 2013-12-30: 85 via SUBCUTANEOUS
  Administered 2013-12-30: 13:00:00 via SUBCUTANEOUS
  Administered 2013-12-30: 85 via SUBCUTANEOUS
  Administered 2013-12-30 (×2): via SUBCUTANEOUS
  Administered 2013-12-31: 7.5 via SUBCUTANEOUS
  Administered 2013-12-31: 0.07 via SUBCUTANEOUS
  Filled 2013-12-28: qty 1

## 2013-12-28 MED ORDER — ONDANSETRON 4 MG PO TBDP: 4.0000 mg | ORAL_TABLET | Freq: Three times a day (TID) | ORAL | Status: AC | PRN

## 2013-12-28 NOTE — Progress Notes (Signed)
    Subjective: Procedure(s) (LRB): A LIF L5-S1 /ANTERIOR LUMBAR INTERBODY FUSION (LEVEL 1) (N/A) ABDOMINAL EXPOSURE (N/A) 1 Day Post-Op  Patient reports pain as 3 on 0-10 scale.  Reports decreased leg pain reports incisional back pain   Positive void Negative bowel movement Negative flatus Negative chest pain or shortness of breath  Objective: Vital signs in last 24 hours: Temp:  [97.7 F (36.5 C)-98.8 F (37.1 C)] 98.3 F (36.8 C) (05/01 0430) Pulse Rate:  [78-94] 91 (05/01 0430) Resp:  [11-25] 20 (05/01 0740) BP: (88-123)/(45-69) 111/57 mmHg (05/01 0430) SpO2:  [89 %-99 %] 92 % (05/01 0740) Arterial Line BP: (76-104)/(39-51) 104/50 mmHg (04/30 1500)  Intake/Output from previous day: 04/30 0701 - 05/01 0700 In: 3720 [P.O.:720; I.V.:2500; IV Piggyback:500] Out: 2475 [Urine:2275; Blood:200]  Labs:  Recent Labs  12/28/13 0558  WBC 13.0*  RBC 3.92*  HCT 29.4*  PLT 188    Recent Labs  12/28/13 0558  NA 139  K 4.7  CL 101  CO2 23  BUN 27*  CREATININE 1.30  GLUCOSE 173*  CALCIUM 8.8   No results found for this basename: LABPT, INR,  in the last 72 hours  Physical Exam: Neurologically intact ABD soft Intact pulses distally Incision: dressing C/D/I and no drainage Compartment soft  Assessment/Plan: Patient stable  xrays hardware satisfactory.  Positive dilated loops of bowel - ? ileus Continue mobilization with physical therapy Continue care  Up with therapy D/C IV fluids Bowel meds for constipation Possible d/c Saturday or Sunday  Melina Schools, [place] [phone]

## 2013-12-28 NOTE — Evaluation (Signed)
Physical Therapy Evaluation Patient Details Name: Eugene [name] MRN: [id number] DOB: [birth year] Today's Date: 12/28/2013   History of Present Illness  63 y.o. male s/p L5-S1 fusion with abdominal exposure  Clinical Impression  Patient is seen following the above procedure for functional limitations due to the deficits listed below (see PT Problem List). Pt with notable SpO2 changes during therapy session (See vitals below.) He is ambulating well up to 250 feet and has completed stair training Patient will benefit from skilled PT to increase their independence and safety with mobility to allow discharge to the venue listed below. Will follow patient until d/c when determined to be medically ready to go home.     Follow Up Recommendations No PT follow up;Supervision/Assistance - 24 hour    Equipment Recommendations  None recommended by PT    Recommendations for Other Services OT consult     Precautions / Restrictions Precautions Precautions: Back Precaution Booklet Issued: Yes (comment) Precaution Comments: Reviewed Required Braces or Orthoses: Spinal Brace Restrictions Weight Bearing Restrictions: No      Mobility  Bed Mobility Overal bed mobility: Needs Assistance Bed Mobility: Rolling;Sidelying to Sit Rolling: Supervision Sidelying to sit: Min assist       General bed mobility comments: supervision with rolling with min assist for sidelying to sit for trunk control. Verbal cues for log roll and technique to safely maintain back precautions.  Transfers Overall transfer level: Needs assistance Equipment used: Rolling walker (2 wheeled) Transfers: Sit to/from Stand Sit to Stand: Min guard         General transfer comment: Min guard for sit<>stand from lowest bed setting x2. Pt with decreased SpO2 on room air upon standing to 79% from 92% in sitting. Pt with notable wheezing. SpO2 returned to 90% shortly after 3L supplemental oxygen applied. Monitored frequently for  safety. Verbal cues provided for hand placement and technique to maintain back precautions.  Ambulation/Gait Ambulation/Gait assistance: Min guard Ambulation Distance (Feet): 250 Feet Assistive device: Rolling walker (2 wheeled) Gait Pattern/deviations: Step-through pattern;Decreased stride length Gait velocity: decreased   General Gait Details: Pt ambulates generally well, with minimal cues for increased stride length and forward gaze. Pt does not rely heavily on RW.  Stairs Stairs: Yes Stairs assistance: Min guard Stair Management: One rail Right;Step to pattern;Forwards Number of Stairs: 2 General stair comments: Pt safely ascend/descend steps with cues for sequencing. Pt used one rail and showed good stability. states he understands how to safely perform this task and has no further questions  Wheelchair Mobility    Modified Rankin (Stroke Patients Only)       Balance Overall balance assessment: Modified Independent                                           Pertinent Vitals/Pain SpO2 in sitting on 2L supplemental O2 88-93% SpO2 in sitting on room air 92% SpO2 standing on room air 79-83% SpO2 ambulating with 2L supplemental O2 88%  Pain moderate to high - no numerical value given Pt repositioned for comfort upright in chair.    Home Living Family/patient expects to be discharged to:: Private residence Living Arrangements: Spouse/significant other Available Help at Discharge: Family;Available 24 hours/day Type of Home: House Home Access: Stairs to enter Entrance Stairs-Rails: Right Entrance Stairs-Number of Steps: 5 Home Layout: Two level;Bed/bath upstairs Home Equipment: Walker - 2 wheels;Bedside commode  Prior Function Level of Independence: Independent               Hand Dominance   Dominant Hand: Right    Extremity/Trunk Assessment   Upper Extremity Assessment: Overall WFL for tasks assessed           Lower Extremity  Assessment: Generalized weakness         Communication   Communication: No difficulties  Cognition Arousal/Alertness: Awake/alert Behavior During Therapy: WFL for tasks assessed/performed Overall Cognitive Status: Within Functional Limits for tasks assessed                      General Comments General comments (skin integrity, edema, etc.): Reviewed back precautions, log rolling for bed mobility, and other safe mobility techniques while maintaining back precautions. Pt with SpO2 changes that were continuoulsy monitored throughout therapy (see vitals below)    Exercises General Exercises - Lower Extremity Ankle Circles/Pumps: AROM;Both;10 reps;Supine      Assessment/Plan    PT Assessment Patient needs continued PT services  PT Diagnosis Difficulty walking;Abnormality of gait;Acute pain   PT Problem List Decreased strength;Decreased range of motion;Decreased activity tolerance;Decreased balance;Decreased mobility;Decreased knowledge of use of DME;Decreased knowledge of precautions;Cardiopulmonary status limiting activity;Pain  PT Treatment Interventions DME instruction;Gait training;Stair training;Functional mobility training;Therapeutic activities;Therapeutic exercise;Balance training;Neuromuscular re-education;Patient/family education;Modalities   PT Goals (Current goals can be found in the Care Plan section) Acute Rehab PT Goals Patient Stated Goal: go home PT Goal Formulation: With patient Time For Goal Achievement: 01/04/14 Potential to Achieve Goals: Good    Frequency Min 5X/week   Barriers to discharge        Co-evaluation               End of Session Equipment Utilized During Treatment: Gait belt;Back brace Activity Tolerance: Patient tolerated treatment well Patient left: in chair;with call bell/phone within reach;with family/visitor present Nurse Communication: Mobility status;Other (comment) (SpO2 changes during therapy)         Time:  9622-2979 PT Time Calculation (min): 51 min   Charges:   PT Evaluation $Initial PT Evaluation Tier I: 1 Procedure PT Treatments $Gait Training: 8-22 mins $Therapeutic Activity: 23-37 mins   PT G Codes:         Elayne Snare, [place] Yantis 12/28/2013, 10:47 AM

## 2013-12-28 NOTE — Progress Notes (Addendum)
Inpatient Diabetes Program Recommendations  AACE/ADA: New Consensus Statement on Inpatient Glycemic Control (2013)  Target Ranges:  Prepandial:   less than 140 mg/dL      Peak postprandial:   less than 180 mg/dL (1-2 hours)      Critically ill patients:  140 - 180 mg/dL   Reason for Visit: Results for Eugene [name], Eugene [name] (MRN [id number]) as of 12/28/2013 11:57  Ref. Range 12/27/2013 20:21 12/28/2013 00:05 12/28/2013 04:32 12/28/2013 08:27 12/28/2013 11:20  Glucose-Capillary Latest Range: 70-99 mg/dL 362 (H) 202 (H) 151 (H) 198 (H) 211 (H)   Diabetes history: Likely Type 2- States that he was put on insulin pump by MD at the [place]. Outpatient Diabetes medications: Insulin pump Total basal=40.5 units/24 hours, 12a-1.6 units/hr, 7a-1.725 units/hr, He then boluses for meals- Current orders for Inpatient glycemic control: Insulin pump is currently off.  Spoke with Benjiman Core, PA regarding possibly adding basal insulin x 1 until insulin pump restarted. He does have insulin pump supplies at the bedside. Orders received for a one time dose of Lantus 25 units.  Patient should be able to restart insulin pump tomorrow around noon (due to Lantus lasting 24 hours). Orders placed per PA for insulin pump to resume tomorrow (12/29/13) at noon.   Discussed with patient and wife.    Thanks, Adah Perl, RN, BC-ADM Inpatient Diabetes Coordinator Pager [phone]

## 2013-12-28 NOTE — Progress Notes (Signed)
[facility] [place]  Subjective  - Some soreness, no nausea   Objective 111/57 91 98.3 F (36.8 C) (Oral) 20 92%  Intake/Output Summary (Last 24 hours) at 12/28/13 1000 Last data filed at 12/28/13 0942  Gross per 24 hour  Intake   2960 ml  Output   2375 ml  Net    585 ml   Dressing intact, no hematoma  Assessment/Planning: Progressive ambulation, and diet   Elam Dutch 12/28/2013 10:00 AM --  Laboratory Lab Results:  Recent Labs  12/28/13 0558  WBC 13.0*  HGB 9.7*  HCT 29.4*  PLT 188   BMET  Recent Labs  12/28/13 0558  NA 139  K 4.7  CL 101  CO2 23  GLUCOSE 173*  BUN 27*  CREATININE 1.30  CALCIUM 8.8    COAG Lab Results  Component Value Date   INR 1.07 03/27/2010   INR 1.06 03/27/2010   No results found for this basename: PTT

## 2013-12-28 NOTE — Progress Notes (Signed)
Pt states that he is able to put himself on CPAP when he is ready. Machine plugged in and set up at bedside, humidity chamber filled. Pt understands how to power on machine. Also, understands to call if he has questions. RT will monitor

## 2013-12-28 NOTE — Evaluation (Signed)
Occupational Therapy Evaluation Patient Details Name: Eugene [name] MRN: [id number] DOB: [birth year] Today's Date: 12/28/2013    History of Present Illness 63 y.o. male s/p L5-S1 fusion with abdominal exposure   Clinical Impression   This 63 yo male Mod I/ Independent pta admitted and underwent above presents to acute OT with increased pain, decreased mobility, decreased knowledge of back precautions all affecting pt's ability to care for himself. Pt will benefit from acute OT without need for follow up.    Follow Up Recommendations  No OT follow up    Equipment Recommendations  Tub/shower bench (reacher, sock aid, toliet aid, long handled sponge, long handled shoe horn)       Precautions / Restrictions Precautions Precautions: Back Required Braces or Orthoses: Spinal Brace Spinal Brace: Applied in sitting position Restrictions Weight Bearing Restrictions: No              ADL Overall ADL's : Needs assistance/impaired                                       General ADL Comments: Pt can get his legs crossed one over the other, but then cannot get to his feet to doff/donn socks due to increased pain in abdominal area (from surgery)               Pertinent Vitals/Pain 8/10 pain--abdomen (incisional pain)     Hand Dominance Right   Extremity/Trunk Assessment Upper Extremity Assessment Upper Extremity Assessment: Overall WFL for tasks assessed           Communication Communication Communication: No difficulties   Cognition Arousal/Alertness: Awake/alert Behavior During Therapy: WFL for tasks assessed/performed Overall Cognitive Status: Within Functional Limits for tasks assessed                                Home Living Family/patient expects to be discharged to:: Private residence Living Arrangements: Spouse/significant other Available Help at Discharge: Family;Available 24 hours/day Type of Home: House Home Access: Stairs  to enter [employer] of Steps: 5 Entrance Stairs-Rails: Right Home Layout: Two level;Bed/bath upstairs Alternate Level Stairs-Number of Steps: 16 Alternate Level Stairs-Rails: Left Bathroom Shower/Tub: Tub/shower unit;Curtain Shower/tub characteristics: Architectural technologist: Standard     Home Equipment: Environmental consultant - 2 wheels;Bedside commode          Prior Functioning/Environment Level of Independence: Independent             OT Diagnosis: Generalized weakness;Acute pain   OT Problem List: Decreased range of motion;Decreased activity tolerance;Impaired balance (sitting and/or standing);Decreased knowledge of use of DME or AE;Decreased knowledge of precautions;Pain   OT Treatment/Interventions: Self-care/ADL training;Patient/family education;DME and/or AE instruction    OT Goals(Current goals can be found in the care plan section) Acute Rehab OT Goals OT Goal Formulation: With patient Time For Goal Achievement: 01/04/14 Potential to Achieve Goals: Good  OT Frequency: Min 2X/week              End of Session    Activity Tolerance: Patient tolerated treatment well Patient left: in chair (waiting on transport to have Bil LE dopplers completed)   Time: 7829-5621 OT Time Calculation (min): 9 min Charges:  OT General Charges $OT Visit: 1 Procedure OT Evaluation $Initial OT Evaluation Tier I: 1 Procedure  Almon Register [phone] 12/28/2013, 3:13 PM

## 2013-12-28 NOTE — Care Management Note (Signed)
CARE MANAGEMENT NOTE 12/28/2013  Patient:  Eugene [name], Eugene [name]   Account Number:  [id number]  Date Initiated:  12/28/2013  Documentation initiated by:  Ricki Miller  Subjective/Objective Assessment:   63 yr old male s/p L5-S1 Anterior lumbar fusion.     Action/Plan:   Case manager spoke with patient concerning Home health and DME needs at discharge. Patient is under worker's comp.Has rolling walker and 3in1.   Anticipated DC Date:  12/29/2013   Anticipated DC Plan:  [place]  CM consult      [facility] Choice  HOME HEALTH  DURABLE MEDICAL EQUIPMENT   Choice offered to / List presented to:     DME arranged  [place]      DME agency  OTHER - SEE NOTE     HH arranged  HH-2 PT      [place] agency  OTHER - SEE NOTE   Status of service:  Completed, signed off Medicare Important Message given?   (If response is "NO", the following Medicare IM given date fields will be blank) Date Medicare IM given:   Date Additional Medicare IM given:    Discharge Disposition:  [place]  Per UR Regulation:    If discussed at Long Length of Stay Meetings, dates discussed:    Comments:  12/28/13 Ricki Miller, RN BSN Case Manager [phone] Case manager spoke with Jeral Fruit from [facility] [phone]. Asked to fax orders, H&P to her @ [phone]. They will arrange for home health PT and will notify patient. CM will fax discharge orders to [facility] on case on Monday. Her phone # is [phone].

## 2013-12-28 NOTE — Op Note (Signed)
NAMEMarland Kitchen  MATT, DELPIZZO NO.:  [id number]  MEDICAL RECORD NO.:  [id number]  LOCATION:  5N10C                        FACILITY:  [facility]  PHYSICIAN:  Dahlia Bailiff, MD    DATE OF BIRTH:  May 30, 1951  DATE OF PROCEDURE:  12/27/2013 DATE OF DISCHARGE:                              OPERATIVE REPORT   PREOPERATIVE DIAGNOSES:  Degenerative lumbar disk disease, L5-S1 with discogenic back pain, radicular leg pain.  POSTOPERATIVE DIAGNOSES:  Degenerative lumbar disk disease, L5-S1 with discogenic back pain, radicular leg pain.  OPERATIVE PROCEDURE:  Anterior lumbar interbody fusion, L5-S1.  FIRST ASSISTANT:  Alyson Locket. Ricard Dillon, [place].  He was instrumental in assisting with retraction, visualization, suction, and wound closure.  APPROACH SURGEON:  Dr. Scot Dock  COMPLICATIONS:  None.  CONDITION:  Stable.  HISTORY:  This is a very pleasant 63 year old gentleman who has been having progressive debilitating back pain.  Attempts at conservative management had failed to alleviate his symptoms.  As a result, we elected to proceed with the surgery.  All appropriate risks, benefits, and alternatives to surgery were discussed and consent was obtained.  OPERATIVE NOTE:  The patient was brought to the operating room, placed supine on the operating table.  After successful induction of general anesthesia and endotracheal intubation, TEDs, SCDs __________.  The abdomen was prepped and draped in a standard fashion.  Time-out was taken confirming patient, procedure, and all important pertinent data. Dr. Scot Dock then scrubbed along with Benjiman Core and performed a standard retroperitoneal anterior approach to the lumbar spine.  Once the anterior lumbar spine was visualized and retractors in place, I scrubbed in and continued forward.  We marked the disk space and took an x-ray and confirmed 5-1 disk space. An annulotomy was performed using a #10 blade scalpel.  There were some large  anterior osteophytes which were resected using a Kerrison rongeur. I then removed the bulk of the disk material with various sizes of pituitary rongeurs and curette.  Once I had removed the bulk of the disk material, I then distracted the space and continued working posteriorly. Using a fine curved nerve hook, I was able to release the annulus posteriorly from the S1 vertebral body.  Once I had released this, I was able to then use a 2-mm Kerrison to resect the posterior annulus.  At this point, I then used sequential rasp trial and elected to use a 14 large 8-degree lordotic spacer.  This provided excellent distraction and was well compressed in space.  At this point, I removed it and I made sure I had bleeding subchondral bone and all of the cartilaginous endplate was removed.  There was a very large anterior exostosis but the graft was sitting within the actual disk space itself and was well below the anterior sclerotic osteophytes.  I then packed the cage with chronOS and synthetic DBX since the patient was a Jehovah's Witness and could not use any human allograft tissue.  This was then malleted down to the appropriate depth. I then placed some chronOS along the anterior aspect of the PEEK cage and then placed the RSV inner plate 0 profile over this and malleted it down.  Once I was below  the level of the osteophyte and was on the actual endplate, actual vertebral body I then placed the locking screws 2 into L5 and 1 into S1.  They all had excellent purchase.  I then placed the anterior plate on top of the screws to prevent backout.  This was torqued down according to manufacture standard.  I then sequentially removed the retracting devices.  There was no active hemorrhage noted. I then closed the fascia of the rectus with a running #1 Prolene suture, superficial with 2-0 Vicryl sutures, and 3-0 Monocryl for the skin. Steri-Strips and dry dressing were applied.  An intraoperative AP  x-ray was done to confirm that there was no surgical instrumentation left in the wound other than the implant and surgical vascular clips.  Steri- Strips and dry dressing were applied.  The patient was extubated, transferred to PACU without incident.  At the end of the case, all needle and sponge counts were correct.  There were no adverse intraoperative events.     Dahlia Bailiff, MD     DDB/MEDQ  D:  12/27/2013  T:  12/28/2013  Job:  [id number]  cc:   Dr. Doren Custard

## 2013-12-28 NOTE — Progress Notes (Signed)
VASCULAR LAB PRELIMINARY  PRELIMINARY  PRELIMINARY  PRELIMINARY  Bilateral lower extremity venous duplex completed.    Preliminary report:  Bilateral:  Negative for Deep and superficial vein thrombosis.  Margarette Canada, RVT 12/28/2013, 5:10 PM

## 2013-12-29 LAB — GLUCOSE, CAPILLARY
GLUCOSE-CAPILLARY: 220 mg/dL — AB (ref 70–99)
Glucose-Capillary: 134 mg/dL — ABNORMAL HIGH (ref 70–99)
Glucose-Capillary: 209 mg/dL — ABNORMAL HIGH (ref 70–99)
Glucose-Capillary: 212 mg/dL — ABNORMAL HIGH (ref 70–99)
Glucose-Capillary: 226 mg/dL — ABNORMAL HIGH (ref 70–99)

## 2013-12-29 NOTE — Progress Notes (Signed)
Laureano Hetzer  MRN: [id number] DOB/Age: Jan 07, 1951 63 y.o. Physician: Rada Hay Procedure: Procedure(s) (LRB): A LIF L5-S1 /ANTERIOR LUMBAR INTERBODY FUSION (LEVEL 1) (N/A) ABDOMINAL EXPOSURE (N/A)     Subjective: Sitting up in chair, complaints of some coughing through night with "hemoptsis". His fiancee saved the paper towels, But no c/o chest pain or shortness of breath. Had oxygen placed back last night because his sats dropped a little. Currently using his BSIS and sats back up  Vital Signs Temp:  [98.1 F (36.7 C)-102.1 F (38.9 C)] 98.1 F (36.7 C) (05/02 0603) Pulse Rate:  [101-111] 101 (05/02 0603) Resp:  [18-20] 18 (05/02 0603) BP: (102-145)/(50-70) 102/50 mmHg (05/02 0603) SpO2:  [90 %-95 %] 92 % (05/02 0603)  Lab Results  Recent Labs  12/28/13 0558  WBC 13.0*  HGB 9.7*  HCT 29.4*  PLT 188   BMET  Recent Labs  12/28/13 0558  NA 139  K 4.7  CL 101  CO2 23  GLUCOSE 173*  BUN 27*  CREATININE 1.30  CALCIUM 8.8   INR  Date Value Ref Range Status  03/27/2010 1.07  0.00 - 1.49 Final     Exam Washcloth with mostly clear sputum with a little blood tinge. Certainly no gross hemoptysis Moving feet and legs well Still sluggish bowel patterns        Plan His hemoptysis certainly could represent irritated airways and he is currently asymptomatic so will observe and wean off oxygen and see how does. If persists certainly could further workup.  Continue PT/Ot and gradual advance of orals as bowel patterns improve.  Olivia Mackie Jacey Eckerson for [employer] 12/29/2013, 10:43 AM

## 2013-12-29 NOTE — Progress Notes (Signed)
PT Cancellation Note  Patient Details Name: Eugene [name] MRN: [id number] DOB: 09/09/1950   Cancelled Treatment:    Reason Eval/Treat Not Completed: Patient declined, no reason specified.  At 1300 pt had just got settled in bed from being up in chair and then to bathroom since OT session before lunch. Too tired at this time. Will follow up today as time allows vs in am, pt and spouse in agreement.   Willow Ora 12/29/2013, 3:45 PM  Willow Ora, PTA Office- [phone]

## 2013-12-29 NOTE — Progress Notes (Signed)
Occupational Therapy Treatment Patient Details Name: Gasper Hopes MRN: [id number] DOB: 08-Jul-1951 Today's Date: 12/29/2013    History of present illness 63 y.o. male s/p L5-S1 fusion with abdominal exposure   OT comments  Pt progressing toward acute OT goals. Pt ambulated to therapy gym min guard with several standing rest breaks and cues for breathing technique. O2 stats in ambulation hovered on average between 89-91. Pt c/o lightheadness but declined sitting break during ambulation. Once in the gym pt rested in chair while therapist educated and demonstrated tub transfer bench technique. Pt and spouse indicated understanding of technique and mentioned access to walk-in shower with discussion of that technique added as well. Pt ambulated back to room with slightly less rest breaks needed. Pt and spouse stated that pt has not eaten much today due to pt dislike of foods presented. Encouraged pt to eat an adequate amount for lunch and discussed options.  Follow Up Recommendations  No OT follow up    Equipment Recommendations  Tub/shower bench    Recommendations for Other Services      Precautions / Restrictions Precautions Precautions: Back Precaution Comments: Reviewed Required Braces or Orthoses: Spinal Brace Spinal Brace: Applied in sitting position Restrictions Weight Bearing Restrictions: No       Mobility Bed Mobility                  Transfers Overall transfer level: Needs assistance Equipment used: Rolling walker (2 wheeled) Transfers: Sit to/from [employer] Transfers Sit to Stand: Min guard Stand pivot transfers: Min guard       General transfer comment: sit<>stand from recliner and rolling desk chairs (wheels braced)    Balance                                   ADL Overall ADL's : Needs assistance/impaired Eating/Feeding: Set up;Sitting                               Tub/ Shower Transfer: Min guard;Ambulation;Tub  bench;Rolling walker;Adhering to back precautions Tub/Shower Transfer Details (indicate cue type and reason): demonstrated tub transfer bench technique with pt and spouse indicating understanding of technique. Spouse stated they have access to walkin shower with room for rw and 3n1. Discussed this technique as well. Functional mobility during ADLs: Min guard;Rolling walker General ADL Comments: Pt ambulated min guard to therapy gym to practice tub transfer bench. Pt taking several short breaks in standing. Oxygen stats 89-91 on average when walking, cues to breath through nose and not hold breath during transfers and finctional activities. Pt c/o lightheadedness during ambulation; declined to practice transfer, needed a rest break in the gym.. Per pt and spouse report pt has not been eating enough due to dislike of foods presented. Encouraged pt to eat something for lunch to improve how he feels OOB.      Vision                     Perception     Praxis      Cognition   Behavior During Therapy: [facility] for tasks assessed/performed Overall Cognitive Status: Within Functional Limits for tasks assessed                       Extremity/Trunk Assessment  Exercises     Shoulder Instructions       General Comments      Pertinent Vitals/ Pain      6/10 pain back. Increased activity during session  Home Living                                          Prior Functioning/Environment              Frequency Min 2X/week     Progress Toward Goals  OT Goals(current goals can now be found in the care plan section)  Progress towards OT goals: Progressing toward goals  Acute Rehab OT Goals OT Goal Formulation: With patient Time For Goal Achievement: 01/04/14 Potential to Achieve Goals: Good ADL Goals Pt Will Perform Grooming: with set-up;with supervision;standing Pt Will Perform Lower Body Bathing: with set-up;with supervision;with  adaptive equipment;sit to/from stand Pt Will Perform Lower Body Dressing: with set-up;with supervision;with adaptive equipment;sit to/from stand Pt Will Transfer to Toilet: with supervision;ambulating Pt Will Perform Toileting - Clothing Manipulation and hygiene: with supervision;with adaptive equipment;sit to/from stand Pt Will Perform Tub/Shower Transfer: with min guard assist;ambulating;tub bench;rolling walker Additional ADL Goal #1: Pt will be S for in and OOB for proper technique to protect back Additional ADL Goal #2: Pt will be able to state 3/3 back precautions  Plan Discharge plan remains appropriate    Co-evaluation                 End of Session Equipment Utilized During Treatment: Gait belt;Rolling walker   Activity Tolerance Other (comment) (Pt limited by lightheadedness )   Patient Left in chair;with call bell/phone within reach;with family/visitor present   Nurse Communication Other (comment) (O2 stats during ambulation)        Time: 2025-4270 OT Time Calculation (min): 47 min  Charges: OT General Charges $OT Visit: 1 Procedure OT Treatments $Self Care/Home Management : 8-22 mins $Therapeutic Activity: 23-37 mins  Hortencia Pilar 12/29/2013, 12:00 PM

## 2013-12-29 NOTE — Progress Notes (Signed)
During shift change patient reported "coughing up blood". Upon assessment patient denied shortness of breath. Lung sounds diminished with no crackles present. Sputum blood tinged. Patient reported that cough has been present for "several days" and that first recognition of blood tinged sputum was during the night. MD paged.

## 2013-12-29 NOTE — Progress Notes (Signed)
Pt. States he can place cpap on himself. RT informed pt. To notify if he needs any assistance. 

## 2013-12-29 NOTE — Progress Notes (Signed)
[facility]  Subjective  - Still lethargic today but less pain,  No nausea but no real appetite,  Apparently had some bloody sputum   Objective 102/50 101 98.1 F (36.7 C) (Oral) 18 92%  Intake/Output Summary (Last 24 hours) at 12/29/13 0959 Last data filed at 12/28/13 1700  Gross per 24 hour  Intake    480 ml  Output   1125 ml  Net   -645 ml   Abdomen soft  Feet warm bilaterally  Assessment/Planning: 1.  S/p anterior exposure eating some but no real gut function so far, no nausea at this point so encourage ambulation to improve gut function  2. Hemoptysis, DVT study yesterday negative.  Will defer further work up to Dr Rolena Infante but if high index of suspicion would recommend PE CT.  Otherwise this may just represent ET tube trauma  Elam Dutch 12/29/2013 9:59 AM --  Laboratory Lab Results:  Recent Labs  12/28/13 0558  WBC 13.0*  HGB 9.7*  HCT 29.4*  PLT 188   BMET  Recent Labs  12/28/13 0558  NA 139  K 4.7  CL 101  CO2 23  GLUCOSE 173*  BUN 27*  CREATININE 1.30  CALCIUM 8.8    COAG Lab Results  Component Value Date   INR 1.07 03/27/2010   INR 1.06 03/27/2010   No results found for this basename: PTT

## 2013-12-30 LAB — GLUCOSE, CAPILLARY
GLUCOSE-CAPILLARY: 141 mg/dL — AB (ref 70–99)
Glucose-Capillary: 117 mg/dL — ABNORMAL HIGH (ref 70–99)
Glucose-Capillary: 124 mg/dL — ABNORMAL HIGH (ref 70–99)
Glucose-Capillary: 174 mg/dL — ABNORMAL HIGH (ref 70–99)
Glucose-Capillary: 206 mg/dL — ABNORMAL HIGH (ref 70–99)

## 2013-12-30 LAB — HEMOGLOBIN AND HEMATOCRIT, BLOOD
HCT: 30.5 % — ABNORMAL LOW (ref 39.0–52.0)
Hemoglobin: 10.3 g/dL — ABNORMAL LOW (ref 13.0–17.0)

## 2013-12-30 NOTE — Progress Notes (Signed)
Physical Therapy Treatment Patient Details Name: Eugene [name] MRN: [id number] DOB: November 13, 1950 Today's Date: 12-31-13    History of Present Illness 63 y.o. male s/p L5-S1 fusion with abdominal exposure    PT Comments    Pt making great progress with mobility. Stair education complete. Pt safe to discharge home with family support.  Follow Up Recommendations  No PT follow up;Supervision/Assistance - 24 hour     Equipment Recommendations  None recommended by PT    Recommendations for Other Services OT consult     Precautions / Restrictions Precautions Precautions: Back Precaution Booklet Issued: Yes (comment) Precaution Comments: pt recalled 3/3 back precautions Required Braces or Orthoses: Spinal Brace Spinal Brace: Applied in sitting position Restrictions Weight Bearing Restrictions: No    Mobility  Bed Mobility         General bed mobility comments: not assessed:up with OT in rehab gym at start of session and to chair after session  Transfers Overall transfer level: Needs assistance Equipment used: Rolling walker (2 wheeled) Transfers: Sit to/from Stand Sit to Stand: Supervision         General transfer comment: pt able to stand from low chair with no arm rests with cues to push up on mat table next to chair and one hand on walker.  Ambulation/Gait Ambulation/Gait assistance: Supervision;Modified independent (Device/Increase time) Ambulation Distance (Feet): 800 Feet Assistive device: Rolling walker (2 wheeled) Gait Pattern/deviations: Step-through pattern;WFL(Within Functional Limits) Gait velocity: decreased Gait velocity interpretation: Below normal speed for age/gender General Gait Details: as gait progressed pt's stride increased and gait was [facility]. no cues needed. SaO2 >95% on room air throughout session.   Stairs   Stairs assistance: Supervision Stair Management: One rail Right;One rail Left;Forwards Number of Stairs: 16 General stair  comments: 4 stairs with right rail with supervision.flight with left rail with supervision.      Cognition Arousal/Alertness: Awake/alert Behavior During Therapy: WFL for tasks assessed/performed Overall Cognitive Status: Within Functional Limits for tasks assessed                PT Goals (current goals can now be found in the care plan section) Acute Rehab PT Goals Patient Stated Goal: go home PT Goal Formulation: With patient Time For Goal Achievement: 01/04/14 Potential to Achieve Goals: Good Progress towards PT goals: Progressing toward goals    Frequency  Min 5X/week    PT Plan Current plan remains appropriate    End of Session Equipment Utilized During Treatment: Gait belt;Back brace Activity Tolerance: Patient tolerated treatment well Patient left: in chair;with call bell/phone within reach;with family/visitor present     Time: 3536-1443 PT Time Calculation (min): 23 min  Charges:  $Gait Training: 23-37 mins                    G Codes:      Willow Ora Dec 31, 2013, 12:48 PM  Willow Ora, PTA Office- [phone]

## 2013-12-30 NOTE — Progress Notes (Signed)
Occupational Therapy Treatment Patient Details Name: Eugene [name] MRN: [id number] DOB: 01-Jul-1951 Today's Date: 12/30/2013    History of present illness 63 y.o. male s/p L5-S1 fusion with abdominal exposure   OT comments  Pt seen today for ADL session to increase independence prior to d/c home. Pt with decreased pain and improved mobility this date. Pt able to ambulate hallway to rehab gym to practice shower transfer. Pt is progressing towards goals and recalls 3/3 back precautions. Pt would continue to benefit from skilled OT to further increase safety and awareness with d/c home.    Follow Up Recommendations  No OT follow up;Supervision - Intermittent    Equipment Recommendations  None recommended by OT       Precautions / Restrictions Precautions Precautions: Back Precaution Booklet Issued: Yes (comment) Precaution Comments: pt recalled 3/3 back precautions Required Braces or Orthoses: Spinal Brace Spinal Brace: Applied in sitting position Restrictions Weight Bearing Restrictions: No       Mobility Bed Mobility  not assessed- pt up in room when OT arrived.                 Transfers Overall transfer level: Needs assistance Equipment used: Rolling walker (2 wheeled) Transfers: Sit to/from Stand Sit to Stand: Supervision         General transfer comment: Pt with improved sit<>stand with one hand on RW and one hand on 3N1 due to back precautions.     Balance Overall balance assessment: Modified Independent                                 ADL Overall ADL's : Needs assistance/impaired     Grooming: Wash/dry hands;Supervision/safety;Standing                   Toilet Transfer: Supervision/safety;Ambulation;BSC;RW   Toileting- Clothing Manipulation and Hygiene: Supervision/safety;Sit to/from stand;Adhering to back precautions   Tub/ Shower Transfer: Walk-in shower;Supervision/safety;Ambulation;Adhering to back precautions;Rolling  walker;3 in 1   Functional mobility during ADLs: Supervision/safety;Rolling walker General ADL Comments: Pt overall supervision for functional ambulation and able to ambulate to gym from room. Demonstrated AE for LB ADLs but pt states his wife will assist.                 Cognition  Arousal/Alertness: Awake/Alert Behavior During Therapy: WFL for tasks assessed/performed Overall Cognitive Status: Within Functional Limits for tasks assessed                                    Pertinent Vitals/ Pain       Pt reports pain as improved, but did not rate his pain level.          Frequency Min 2X/week     Progress Toward Goals  OT Goals(current goals can now be found in the care plan section)  Progress towards OT goals: Progressing toward goals     Plan Discharge plan remains appropriate       End of Session Equipment Utilized During Treatment: Gait belt;Rolling walker;Back brace   Activity Tolerance Patient tolerated treatment well   Patient Left Other (comment) (in rehab gym with PT and wife)           Time: 8527-7824 OT Time Calculation (min): 12 min  Charges: OT General Charges $OT Visit: 1 Procedure OT Treatments $Self Care/Home Management : 8-22 mins  Juluis Rainier [phone] 12/30/2013, 11:57 AM

## 2013-12-30 NOTE — Progress Notes (Addendum)
Patients hemoglobin 10.3 and hematocrit 30.5. Patients wife stated that the patient was on iron supplements before the patient had surgery but were ordered to be discontinued one week prior to surgery. Patients wife states that the patient may need iron supplements to be started again.

## 2013-12-30 NOTE — Progress Notes (Signed)
Clinical Education officer, museum (CSW) received consult for SNF placement. Per RN case manager's note patient is going home with home health. Please reconsult if further social work needs arise. CSW signing off.   Blima Rich, [place] Weekend CSW [phone]

## 2013-12-30 NOTE — Progress Notes (Signed)
Subjective: 3 Days Post-Op Procedure(s) (LRB): A LIF L5-S1 /ANTERIOR LUMBAR INTERBODY FUSION (LEVEL 1) (N/A) ABDOMINAL EXPOSURE (N/A) Patient reports pain as mild.  Soreness at incision site. Tolerating PO's well. Positive flatus and BM. Coughing is occasional now without blood. He does repot weakness and dizziness when ambulating.  Denies SOB or CP.   Objective: Vital signs in last 24 hours: Temp:  [98.2 F (36.8 C)-100.5 F (38.1 C)] 98.2 F (36.8 C) (05/03 0411) Pulse Rate:  [93-105] 93 (05/03 0411) Resp:  [18] 18 (05/03 0411) BP: (112-135)/(52-76) 112/63 mmHg (05/03 0411) SpO2:  [94 %-98 %] 97 % (05/03 0411)  Intake/Output from previous day: 05/02 0701 - 05/03 0700 In: 480 [P.O.:480] Out: -  Intake/Output this shift:     Recent Labs  12/28/13 0558  HGB 9.7*    Recent Labs  12/28/13 0558  WBC 13.0*  RBC 3.92*  HCT 29.4*  PLT 188    Recent Labs  12/28/13 0558  NA 139  K 4.7  CL 101  CO2 23  BUN 27*  CREATININE 1.30  GLUCOSE 173*  CALCIUM 8.8   No results found for this basename: LABPT, INR,  in the last 72 hours  Alert and oriented x3. Left ABD dressing C/D/I. BS x4. Tender to gentle palpation.   Assessment/Plan: 3 Days Post-Op Procedure(s) (LRB): A LIF L5-S1 /ANTERIOR LUMBAR INTERBODY FUSION (LEVEL 1) (N/A) ABDOMINAL EXPOSURE (N/A) Up with PT Family has concerns about D/c today will plan for tomorrow. Continue current care Check H&H  Hemioptysis: Improved.    Eugene [name] L Sylva Overley 12/30/2013, 8:50 AM

## 2013-12-31 LAB — GLUCOSE, CAPILLARY
GLUCOSE-CAPILLARY: 108 mg/dL — AB (ref 70–99)
Glucose-Capillary: 119 mg/dL — ABNORMAL HIGH (ref 70–99)
Glucose-Capillary: 122 mg/dL — ABNORMAL HIGH (ref 70–99)
Glucose-Capillary: 142 mg/dL — ABNORMAL HIGH (ref 70–99)

## 2013-12-31 NOTE — Progress Notes (Signed)
Physical Therapy Treatment Patient Details Name: Eugene [name] Eugene [name] MRN: [id number] DOB: Nov 18, 1950 Today's Date: 12/31/2013    History of Present Illness 63 y.o. male s/p L5-S1 fusion with abdominal exposure    PT Comments    Patient modified independent with all activity. Ready to DC from a therapy standpoint.   Follow Up Recommendations  No PT follow up;Supervision/Assistance - 24 hour     Equipment Recommendations  None recommended by PT    Recommendations for Other Services       Precautions / Restrictions Precautions Precautions: Back Precaution Comments: pt recalled 3/3 back precautions Required Braces or Orthoses: Spinal Brace Spinal Brace: Applied in sitting position Restrictions Weight Bearing Restrictions: No    Mobility  Bed Mobility Overal bed mobility: Modified Independent                Transfers Overall transfer level: Modified independent                  Ambulation/Gait Ambulation/Gait assistance: Modified independent (Device/Increase time) Ambulation Distance (Feet): 800 Feet             Stairs     Stair Management: One rail Left;Forwards Number of Stairs: 16    Wheelchair Mobility    Modified Rankin (Stroke Patients Only)       Balance                                    Cognition Arousal/Alertness: Awake/alert Behavior During Therapy: WFL for tasks assessed/performed Overall Cognitive Status: Within Functional Limits for tasks assessed                      Exercises      General Comments        Pertinent Vitals/Pain no apparent distress     Home Living                      Prior Function            PT Goals (current goals can now be found in the care plan section) Progress towards PT goals: Progressing toward goals    Frequency  Min 5X/week    PT Plan Current plan remains appropriate    Co-evaluation             End of Session Equipment Utilized  During Treatment: Back brace Activity Tolerance: Patient tolerated treatment well Patient left: in chair;with call bell/phone within reach;with family/visitor present     Time: 0937-1000 PT Time Calculation (min): 23 min  Charges:  $Gait Training: 23-37 mins                    G Codes:      Eugene [name] Eugene [name] Eugene [name] Eugene [name] 12/31/2013, 11:38 AM

## 2013-12-31 NOTE — Discharge Summary (Signed)
Agree with above 

## 2013-12-31 NOTE — Discharge Summary (Signed)
Patient ID: Eugene [name] MRN: [id number] DOB/AGE: Jun 04, 1951 63 y.o.  Admit date: 12/27/2013 Discharge date: 12/31/2013  Admission Diagnoses:  Active Problems:   Back pain   Discharge Diagnoses:  Active Problems:   Back pain  status post Procedure(s): A LIF L5-S1 /ANTERIOR LUMBAR INTERBODY FUSION (LEVEL 1) ABDOMINAL EXPOSURE  Past Medical History  Diagnosis Date  . Anxiety   . Heart murmur     "not to be concerned"  . GERD (gastroesophageal reflux disease)   . Sickle cell trait   . Refusal of blood transfusions as patient is Jehovah's Witness   . CHF (congestive heart failure)   . Pneumonia 2014    had wheezing with this and was giving the inhaler  . OSA on CPAP   . Type II diabetes mellitus   . DDD (degenerative disc disease)   . Arthritis     "joints; lower back" (12/27/2013)  . Chronic lower back pain   . CLL (chronic lymphocytic leukemia) dx'd 2014    Surgeries: Procedure(s): A LIF L5-S1 /ANTERIOR LUMBAR INTERBODY FUSION (LEVEL 1) ABDOMINAL EXPOSURE on 12/27/2013   Consultants: Treatment Team:  [name], MD  Discharged Condition: Improved  Hospital Course: Eugene [name] is an 63 y.o. male who was admitted 12/27/2013 for operative treatment of lumbar ddd.  . Patient failed conservative treatments (please see the history and physical for the specifics) and had severe unremitting pain that affects sleep, daily activities and work/hobbies. After pre-op clearance, the patient was taken to the operating room on 12/27/2013 and underwent  Procedure(s): A LIF L5-S1 /ANTERIOR LUMBAR INTERBODY FUSION (LEVEL 1) ABDOMINAL EXPOSURE.    Patient was given perioperative antibiotics: Anti-infectives   Start     Dose/Rate Route Frequency Ordered Stop   12/27/13 1700  ceFAZolin (ANCEF) IVPB 1 g/50 mL premix     1 g 100 mL/hr over 30 Minutes Intravenous Every 8 hours 12/27/13 1609 12/28/13 0049   12/27/13 0635  ceFAZolin (ANCEF) IVPB 2 g/50 mL premix     2  g 100 mL/hr over 30 Minutes Intravenous 30 min pre-op 12/27/13 [id number] 12/27/13 0745       Patient was given sequential compression devices and early ambulation to prevent DVT.   Patient benefited maximally from hospital stay and there were no complications. At the time of discharge, the patient was urinating/moving their bowels without difficulty, tolerating a regular diet, pain is controlled with oral pain medications and they have been cleared by PT/OT.   Recent vital signs: Patient Vitals for the past 24 hrs:  BP Temp Temp src Pulse Resp SpO2  12/31/13 0454 107/44 mmHg 98.7 F (37.1 C) Oral 93 18 93 %  12/30/13 2028 128/74 mmHg 98.9 F (37.2 C) Oral 99 18 93 %  12/30/13 1411 132/64 mmHg 99.3 F (37.4 C) Oral 101 18 100 %     Recent laboratory studies:  Recent Labs  12/30/13 1030  HGB 10.3*  HCT 30.5*     Discharge Medications:     Medication List    STOP taking these medications       acetaminophen 500 MG tablet  Commonly known as:  TYLENOL     aspirin 325 MG tablet     multivitamin with minerals Tabs tablet     OVER THE COUNTER MEDICATION     oxycodone 5 MG capsule  Commonly known as:  OXY-IR     sildenafil 100 MG tablet  Commonly known as:  VIAGRA  TAKE these medications       albuterol 108 (90 BASE) MCG/ACT inhaler  Commonly known as:  PROVENTIL HFA;VENTOLIN HFA  Inhale 2 puffs into the lungs every 6 (six) hours as needed for wheezing.     ALLEGRA-D 12 HOUR 60-120 MG per tablet  Generic drug:  fexofenadine-pseudoephedrine  Take 1 tablet by mouth 2 (two) times daily as needed (Allergies).     carvedilol 6.25 MG tablet  Commonly known as:  COREG  Take 3.125 mg by mouth 2 (two) times daily.     clindamycin 1 % Swab  Commonly known as:  CLEOCIN T  Apply 1 application topically daily.     dextrose 4 G chewable tablet  Chew 16 g by mouth as needed for low blood sugar. Repeat every 15 minutes if blood sugar is less than 70.     docusate sodium  100 MG capsule  Commonly known as:  COLACE  Take 1 capsule (100 mg total) by mouth 2 (two) times daily.     enoxaparin 40 MG/0.4ML injection  Commonly known as:  LOVENOX  Inject 0.4 mLs (40 mg total) into the skin daily.     FLUoxetine 20 MG capsule  Commonly known as:  PROZAC  Take 20 mg by mouth daily.     furosemide 20 MG tablet  Commonly known as:  LASIX  Take 20 mg by mouth daily as needed (Diuretic).     gabapentin 300 MG capsule  Commonly known as:  NEURONTIN  Take 300 mg by mouth 3 (three) times daily.     insulin pump Soln  Inject into the skin continuous. novolog     LOVAZA 1 G capsule  Generic drug:  omega-3 acid ethyl esters  Take 2 g by mouth 2 (two) times daily.     metFORMIN 1000 MG tablet  Commonly known as:  GLUCOPHAGE  Take 1,000 mg by mouth 2 (two) times daily.     methocarbamol 500 MG tablet  Commonly known as:  ROBAXIN  Take 1 tablet (500 mg total) by mouth every 6 (six) hours as needed for muscle spasms.     NON FORMULARY  CPAP     omeprazole 20 MG capsule  Commonly known as:  PRILOSEC  Take 20 mg by mouth daily as needed (acid reflux).     ondansetron 4 MG disintegrating tablet  Commonly known as:  ZOFRAN ODT  Take 1 tablet (4 mg total) by mouth every 8 (eight) hours as needed for nausea or vomiting.     oxyCODONE-acetaminophen 10-325 MG per tablet  Commonly known as:  PERCOCET  Take 1 tablet by mouth every 4 (four) hours as needed for pain.     polyethylene glycol packet  Commonly known as:  MIRALAX / GLYCOLAX  Take 17 g by mouth daily.        Diagnostic Studies: Dg Chest 2 View  12/21/2013   CLINICAL DATA:  Preoperative respiratory examination for lumbar fusion. History of diabetes and coronary artery disease.  EXAM: CHEST  2 VIEW  COMPARISON:  03/27/2010.  FINDINGS: The heart size and mediastinal contours are stable. There is stable mild prominence of the right hilum. Previously identified interstitial prominence has improved. There  is no airspace disease, edema or significant pleural effusion. The osseous structures appear stable.  IMPRESSION: Decreased prominence of previously demonstrated interstitial markings, likely representing resolved edema. No acute cardiopulmonary process.   Electronically Signed   By: Camie Patience M.D.   On: 12/21/2013 15:42  Dg Lumbar Spine 2-3 Views  12/28/2013   CLINICAL DATA:  Status post lumbar fusion.  EXAM: LUMBAR SPINE - 2-3 VIEW  COMPARISON:  Plain films lumbar spine 12/27/2013.  FINDINGS: The patient has undergone anterior L5-S1 discectomy and fusion. Hardware is intact. Vertebral body height and alignment are maintained. Facet degenerative disease lower lumbar spine is noted.  IMPRESSION: No acute finding.  Status post L5-S1 fusion.   Electronically Signed   By: Inge Rise M.D.   On: 12/28/2013 08:28   Dg Lumbar Spine 2-3 Views  12/27/2013   CLINICAL DATA:  Lumbar disc disease.  FLUOROSCOPY TIME:  0 min 50 seconds  EXAM: LUMBAR SPINE - 2-3 VIEW; DG C-ARM 61-120 MIN  COMPARISON:  Radiographs dated 12/27/2013  FINDINGS: AP and lateral C-arm images demonstrates the patient has undergone anterior interbody fusion at L5-S1. The hardware appears in good position.  IMPRESSION: Anterior interbody fusion performed at L5-S1.   Electronically Signed   By: Rozetta Nunnery M.D.   On: 12/27/2013 14:46   Dg Lumbar Spine 2-3 Views  12/27/2013   CLINICAL DATA:  Preoperative radiograph prior to lumbar fusion.  EXAM: LUMBAR SPINE - 2-3 VIEW  COMPARISON:  Lumbar spine radiographs performed 01/12/2013  FINDINGS: There is no evidence of fracture or subluxation. Vertebral bodies demonstrate normal height and alignment. There is intervertebral disc space narrowing at L5-S1. The visualized neural foramina are grossly unremarkable in appearance. Mild facet disease noted at the lower lumbar spine.  The visualized bowel gas pattern is unremarkable in appearance; air and stool are noted within the colon. The sacroiliac  joints are within normal limits.  IMPRESSION: No evidence of fracture or subluxation along the lumbar spine.   Electronically Signed   By: Garald Balding M.D.   On: 12/27/2013 06:24   Dg C-arm 61-120 Min  12/27/2013   CLINICAL DATA:  Lumbar disc disease.  FLUOROSCOPY TIME:  0 min 50 seconds  EXAM: LUMBAR SPINE - 2-3 VIEW; DG C-ARM 61-120 MIN  COMPARISON:  Radiographs dated 12/27/2013  FINDINGS: AP and lateral C-arm images demonstrates the patient has undergone anterior interbody fusion at L5-S1. The hardware appears in good position.  IMPRESSION: Anterior interbody fusion performed at L5-S1.   Electronically Signed   By: Rozetta Nunnery M.D.   On: 12/27/2013 14:46   Dg Or Local Abdomen  12/27/2013   CLINICAL DATA:  Instrument count status post lumbar spine fusion.  EXAM: OR LOCAL ABDOMEN  COMPARISON:  None.  FINDINGS: Lumbar spine fusion hardware seen at L5-S1. No other radiopaque foreign body identified within field-of-view.  IMPRESSION: No radiopaque foreign body visualized.   Electronically Signed   By: Earle Gell M.D.   On: 12/27/2013 11:21        Discharge Orders   Future Orders Complete By Expires   Call MD / Call 911  As directed    Constipation Prevention  As directed    Diet - low sodium heart healthy  As directed    Discharge instructions  As directed    Driving restrictions  As directed    Increase activity slowly as tolerated  As directed    Lifting restrictions  As directed       Follow-up Information   Schedule an appointment as soon as possible for a visit with Dahlia Bailiff, MD. (need return office visit 2 weeks postop)    Specialty:  Orthopedic Surgery   Contact information:   [address] [place] [place] [address] [phone]  Discharge Plan:  discharge to home  Disposition:     Signed: Benjiman Core for Dr. Melina Schools [facility] [phone] 12/31/2013, 12:02 PM

## 2013-12-31 NOTE — Progress Notes (Signed)
Subjective: Doing well.  Pain controlled.  Ready to go home.     Objective: Vital signs in last 24 hours: Temp:  [98.7 F (37.1 C)-99.3 F (37.4 C)] 98.7 F (37.1 C) (05/04 0454) Pulse Rate:  [93-101] 93 (05/04 0454) Resp:  [18] 18 (05/04 0454) BP: (107-132)/(44-74) 107/44 mmHg (05/04 0454) SpO2:  [93 %-100 %] 93 % (05/04 0454)  Intake/Output from previous day: 05/03 0701 - 05/04 0700 In: 480 [P.O.:480] Out: -  Intake/Output this shift: Total I/O In: 240 [P.O.:240] Out: -    Recent Labs  12/30/13 1030  HGB 10.3*    Recent Labs  12/30/13 1030  HCT 30.5*   No results found for this basename: NA, K, CL, CO2, BUN, CREATININE, GLUCOSE, CALCIUM,  in the last 72 hours No results found for this basename: LABPT, INR,  in the last 72 hours  Exam:  Wound looks good.   No drainage or signs of infection.  Neurologically intact.   Assessment/Plan: D/c home today. F/u in office 2 weeks postop.  Scripts percocet, robaxin, colace, lovenox, glycolax zofran on chart.  Patient seen with dr Rolena Infante and agrees with plan.     Eugene [name] 12/31/2013, 12:03 PM

## 2014-01-11 ENCOUNTER — Other Ambulatory Visit: Payer: Self-pay | Admitting: Orthopedic Surgery

## 2014-01-11 ENCOUNTER — Ambulatory Visit
Admission: RE | Admit: 2014-01-11 | Discharge: 2014-01-11 | Disposition: A | Discharge status: Home / Self Care | Payer: Worker's Compensation | Source: Ambulatory Visit | Attending: Orthopedic Surgery | Admitting: Orthopedic Surgery

## 2014-01-11 DIAGNOSIS — I2699 Other pulmonary embolism without acute cor pulmonale: Secondary | ICD-10-CM

## 2014-01-11 MED ORDER — IOHEXOL 350 MG/ML SOLN
100.0000 mL | Freq: Once | INTRAVENOUS | Status: AC | PRN
Start: 2014-01-11 — End: 2014-01-11
  Administered 2014-01-11: 100 mL via INTRAVENOUS

## 2014-07-03 IMAGING — CT CT ANGIO CHEST
3 of 6 series · 11 of 30 positions shown · IV contrast (omnipaque)
Comparison: None.

CLINICAL DATA: rule out pulmonary embolism

EXAM:
CT ANGIOGRAPHY CHEST WITH CONTRAST
TECHNIQUE: Multidetector CT imaging of the chest was performed using the
standard protocol during bolus administration of intravenous
contrast. Multiplanar CT image reconstructions and MIPs were
obtained to evaluate the vascular anatomy.
CONTRAST:  100mL OMNIPAQUE IOHEXOL 350 MG/ML SOLN

[Series 5: pe 1.25 · axial · 0.76mm/px · z∈[-258,-24]mm · 6 of 263 slices shown]
[im 38/263  lung]
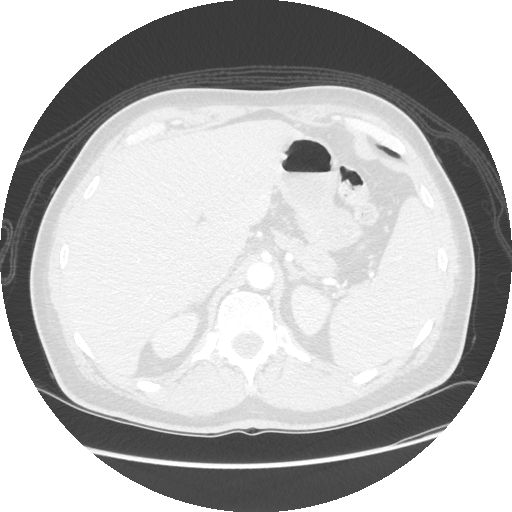
[im 75/263  mediastinal]
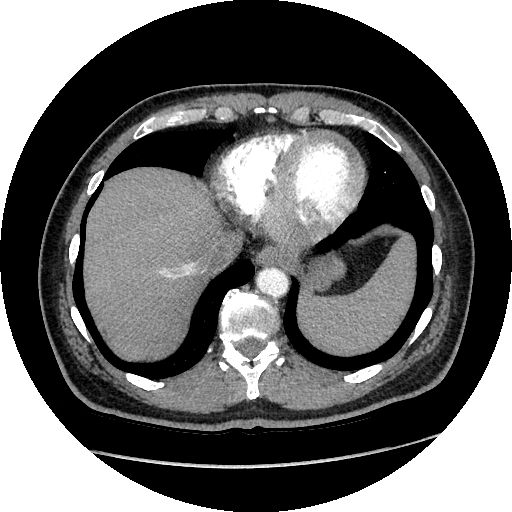
[im 113/263  lung]
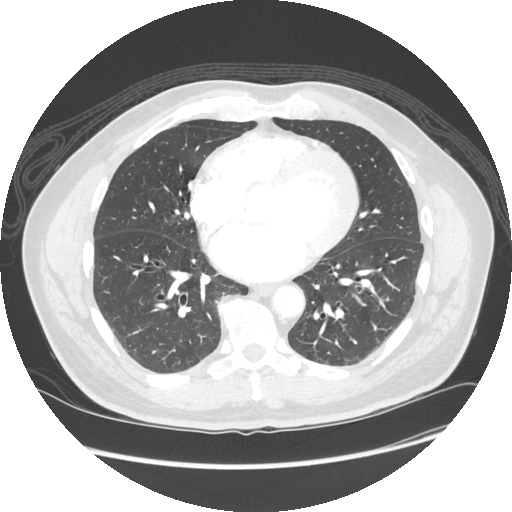
[im 150/263  mediastinal]
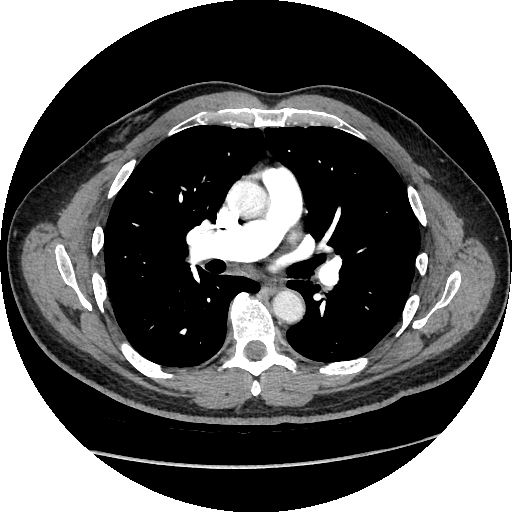
[im 188/263  lung]
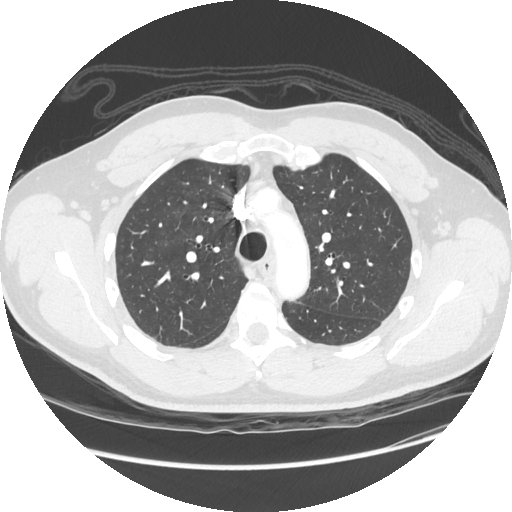
[im 225/263  mediastinal]
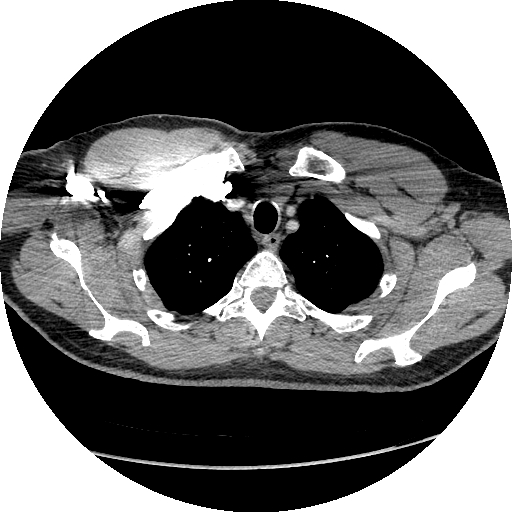

[Series 6: pe 2.5 · axial · 0.76mm/px · z∈[-196,-86]mm · 2 of 132 slices shown]
[im 44/132  lung]
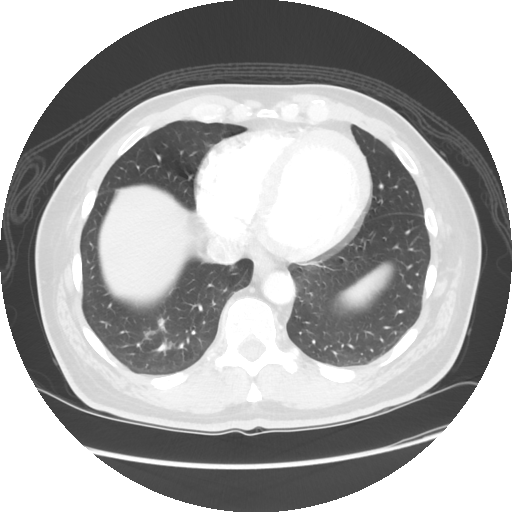
[im 88/132  lung]
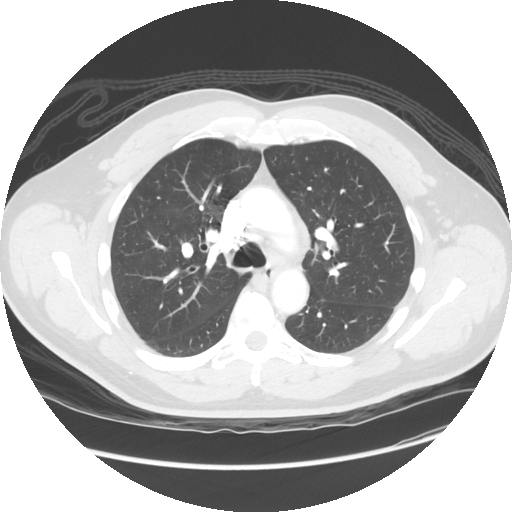

[Series 602: sagittal body · sagittal · 0.76mm/px · 3 of 157 slices shown]
[im 40/157  lung]
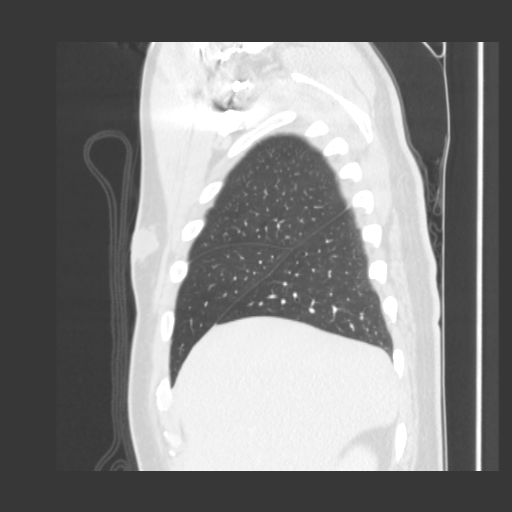
[im 79/157  lung]
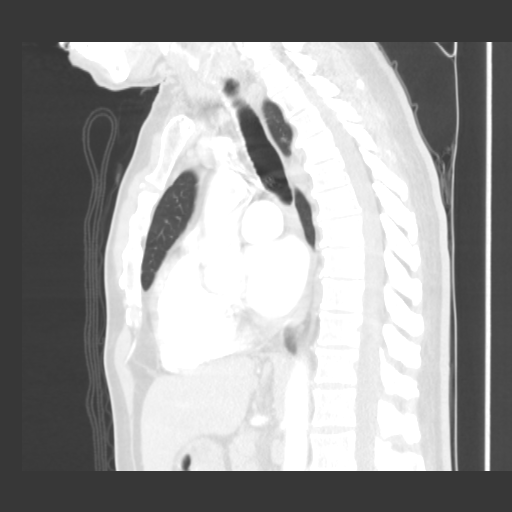
[im 118/157  lung]
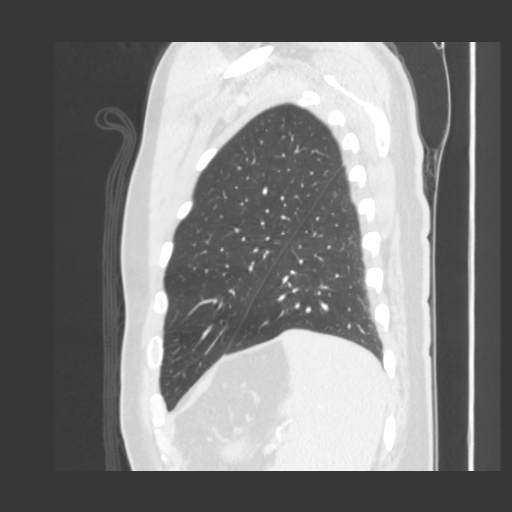

[11 of 30 positions shown; findings below may reference images not displayed]

FINDINGS: The thoracic inlet is unremarkable.

A 1 cm left peritracheal lymph node image 52 series 5. Smaller
subcentimeter lymph nodes within the mediastinum. There is no
evidence of mediastinal mass. Multichamber cardiac enlargement is
appreciated.

Mildly Prominent bilateral axillary lymph nodes appear to contain
fatty hila.

There is no evidence of filling defects within the main, lobar, or
segmental pulmonary arteries. There is no evidence of a thoracic
aortic aneurysm nor dissection.

A 5 mm pulmonary nodule lateral right lower lobe image 80 series 7.
A 1 x 1.4 cm stellate shaped density right lung base image 91 series
7.

Mild areas of subpleural scarring versus atelectasis within the lung
bases.

Visualized upper abdominal viscera demonstrate no gross
abnormalities.

The no aggressive appearing osseous lesions, multilevel spondylosis.
IMPRESSION: Stellate 1 x 1.4 cm density right lung base. Further evaluation with
total body PET-CT is recommended considering the patient's history
of CLL.

A 5 mm nodule periphery right lung base.

No CT evidence of pulmonary arterial embolic disease.

Indeterminate left peritracheal lymph node. Differential
considerations include a reactive lymph node, versus nodal disease
secondary to the patient's CLL, or possibly metastatic disease.

## 2017-01-28 DEATH — deceased
